# Patient Record
Sex: Female | Born: 1950
Health system: Southern US, Community
[De-identification: ages and names within clinical notes are randomized; demographics above are authoritative.]

## PROBLEM LIST (undated history)

## (undated) DIAGNOSIS — J449 Chronic obstructive pulmonary disease, unspecified: Secondary | ICD-10-CM

## (undated) DIAGNOSIS — F329 Major depressive disorder, single episode, unspecified: Secondary | ICD-10-CM

## (undated) DIAGNOSIS — F419 Anxiety disorder, unspecified: Secondary | ICD-10-CM

## (undated) DIAGNOSIS — I729 Aneurysm of unspecified site: Secondary | ICD-10-CM

## (undated) DIAGNOSIS — J439 Emphysema, unspecified: Secondary | ICD-10-CM

## (undated) DIAGNOSIS — M7071 Other bursitis of hip, right hip: Secondary | ICD-10-CM

## (undated) DIAGNOSIS — F32A Depression, unspecified: Secondary | ICD-10-CM

## (undated) HISTORY — DX: Depression, unspecified: F32.A

## (undated) HISTORY — PX: APPENDECTOMY: SHX54

## (undated) HISTORY — DX: Chronic obstructive pulmonary disease, unspecified: J44.9

## (undated) HISTORY — DX: Aneurysm of unspecified site: I72.9

## (undated) HISTORY — PX: OTHER SURGICAL HISTORY: SHX169

## (undated) HISTORY — DX: Other bursitis of hip, right hip: M70.71

## (undated) HISTORY — DX: Anxiety disorder, unspecified: F41.9

## (undated) HISTORY — DX: Major depressive disorder, single episode, unspecified: F32.9

---

## 2012-10-26 ENCOUNTER — Encounter (HOSPITAL_COMMUNITY): Payer: Self-pay | Admitting: Psychiatry

## 2012-10-26 ENCOUNTER — Ambulatory Visit (INDEPENDENT_AMBULATORY_CARE_PROVIDER_SITE_OTHER): Payer: Medicare Other | Admitting: Psychiatry

## 2012-10-26 VITALS — BP 140/83 | HR 95 | Ht 64.0 in | Wt 175.5 lb

## 2012-10-26 DIAGNOSIS — F1421 Cocaine dependence, in remission: Secondary | ICD-10-CM

## 2012-10-26 DIAGNOSIS — F1414 Cocaine abuse with cocaine-induced mood disorder: Secondary | ICD-10-CM

## 2012-10-26 DIAGNOSIS — F1020 Alcohol dependence, uncomplicated: Secondary | ICD-10-CM | POA: Insufficient documentation

## 2012-10-26 DIAGNOSIS — F314 Bipolar disorder, current episode depressed, severe, without psychotic features: Secondary | ICD-10-CM | POA: Insufficient documentation

## 2012-10-26 DIAGNOSIS — F319 Bipolar disorder, unspecified: Secondary | ICD-10-CM
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
## 2012-10-30 ENCOUNTER — Encounter: Payer: Self-pay | Admitting: Family Medicine

## 2012-10-30 ENCOUNTER — Ambulatory Visit (INDEPENDENT_AMBULATORY_CARE_PROVIDER_SITE_OTHER): Payer: Medicare Other | Admitting: Family Medicine

## 2012-10-30 VITALS — BP 114/63 | HR 97 | Wt 172.0 lb

## 2012-10-30 DIAGNOSIS — J449 Chronic obstructive pulmonary disease, unspecified: Secondary | ICD-10-CM | POA: Insufficient documentation

## 2012-10-30 DIAGNOSIS — Z720 Tobacco use: Secondary | ICD-10-CM | POA: Insufficient documentation

## 2012-10-30 DIAGNOSIS — J441 Chronic obstructive pulmonary disease with (acute) exacerbation: Secondary | ICD-10-CM

## 2012-10-30 DIAGNOSIS — I10 Essential (primary) hypertension: Secondary | ICD-10-CM

## 2012-10-30 DIAGNOSIS — F17200 Nicotine dependence, unspecified, uncomplicated: Secondary | ICD-10-CM

## 2012-10-30 MED ORDER — IPRATROPIUM-ALBUTEROL 0.5-2.5 (3) MG/3ML IN SOLN: 3.0000 mL | RESPIRATORY_TRACT | Status: DC

## 2012-10-30 MED ORDER — AZITHROMYCIN 250 MG PO TABS: ORAL_TABLET | ORAL | Status: DC

## 2012-10-30 MED ORDER — PREDNISONE 20 MG PO TABS: 40.0000 mg | ORAL_TABLET | Freq: Every day | ORAL | Status: DC

## 2012-10-30 MED ORDER — HYDROCHLOROTHIAZIDE 25 MG PO TABS: 25.0000 mg | ORAL_TABLET | Freq: Every day | ORAL | Status: DC

## 2012-10-30 MED ORDER — BUDESONIDE-FORMOTEROL FUMARATE 160-4.5 MCG/ACT IN AERO: 2.0000 | INHALATION_SPRAY | Freq: Two times a day (BID) | RESPIRATORY_TRACT | Status: DC
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
## 2012-10-30 NOTE — Patient Instructions (Signed)

## 2012-11-23 ENCOUNTER — Ambulatory Visit (HOSPITAL_COMMUNITY): Payer: Medicare Other | Admitting: Psychiatry

## 2012-11-27 ENCOUNTER — Ambulatory Visit: Payer: Medicare Other | Admitting: Family Medicine

## 2012-12-25 ENCOUNTER — Ambulatory Visit (INDEPENDENT_AMBULATORY_CARE_PROVIDER_SITE_OTHER): Payer: Medicare Other | Admitting: Psychiatry

## 2012-12-25 ENCOUNTER — Encounter (HOSPITAL_COMMUNITY): Payer: Self-pay | Admitting: Psychiatry

## 2012-12-25 VITALS — BP 120/72 | HR 84 | Ht 64.0 in | Wt 176.0 lb

## 2012-12-25 DIAGNOSIS — F1421 Cocaine dependence, in remission: Secondary | ICD-10-CM

## 2012-12-25 DIAGNOSIS — F3160 Bipolar disorder, current episode mixed, unspecified: Secondary | ICD-10-CM

## 2012-12-25 DIAGNOSIS — F1021 Alcohol dependence, in remission: Secondary | ICD-10-CM

## 2012-12-25 DIAGNOSIS — F319 Bipolar disorder, unspecified: Secondary | ICD-10-CM

## 2012-12-25 MED ORDER — RISPERIDONE 2 MG PO TABS: 2.0000 mg | ORAL_TABLET | Freq: Every day | ORAL | Status: DC

## 2012-12-25 MED ORDER — SERTRALINE HCL 100 MG PO TABS: 200.0000 mg | ORAL_TABLET | Freq: Every day | ORAL | Status: DC

## 2012-12-25 MED ORDER — BUPROPION HCL ER (XL) 150 MG PO TB24: 150.0000 mg | ORAL_TABLET | Freq: Every day | ORAL | Status: DC
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
## 2012-12-28 ENCOUNTER — Encounter: Payer: Self-pay | Admitting: Family Medicine

## 2012-12-28 ENCOUNTER — Ambulatory Visit (INDEPENDENT_AMBULATORY_CARE_PROVIDER_SITE_OTHER): Payer: Commercial Managed Care - HMO | Admitting: Family Medicine

## 2012-12-28 VITALS — BP 113/72 | HR 104 | Ht 64.0 in | Wt 172.0 lb

## 2012-12-28 DIAGNOSIS — Z23 Encounter for immunization: Secondary | ICD-10-CM | POA: Diagnosis not present

## 2012-12-28 DIAGNOSIS — G5711 Meralgia paresthetica, right lower limb: Secondary | ICD-10-CM

## 2012-12-28 DIAGNOSIS — J441 Chronic obstructive pulmonary disease with (acute) exacerbation: Secondary | ICD-10-CM

## 2012-12-28 DIAGNOSIS — G5710 Meralgia paresthetica, unspecified lower limb: Secondary | ICD-10-CM | POA: Diagnosis not present

## 2012-12-28 MED ORDER — BUDESONIDE-FORMOTEROL FUMARATE 160-4.5 MCG/ACT IN AERO: 2.0000 | INHALATION_SPRAY | Freq: Two times a day (BID) | RESPIRATORY_TRACT | Status: DC

## 2012-12-28 MED ORDER — GABAPENTIN 300 MG PO CAPS: 300.0000 mg | ORAL_CAPSULE | Freq: Every day | ORAL | Status: DC

## 2012-12-28 NOTE — Patient Instructions (Addendum)
Meralgia Paresthetica  Meralgia paresthetica (MP) is a disorder characterized by tingling, numbness, and burning pain in the outer side of the thigh. It occurs in men more than women. MP is generally found in middle-aged or overweight people. Sometimes, the disorder may disappear. CAUSES The disorder is caused by a nerve in the thigh being squeezed (compressed). MP may be associated with tight clothing, pregnancy, diabetes, and being overweight (obese). SYMPTOMS  Tingling, numbness, and burning in the outer thigh.  An area of the skin may be painful and sensitive to the touch. The symptoms often worsen after walking or standing. TREATMENT  Treatment is based on your symptoms and is mainly supportive. Treatment may include:  Wearing looser clothing.  Losing weight.  Avoiding prolonged standing or walking.  Taking medication.  Surgery if the pain is peristent or severe. MP usually eases or disappears after treatment. Surgery is not always fully successful. Document Released: 05/27/2002 Document Revised: 08/29/2011 Document Reviewed: 06/06/2005 [employer] Patient Information 2014 [employer].  
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
## 2013-01-18 ENCOUNTER — Ambulatory Visit: Payer: Medicare Other | Admitting: Sports Medicine

## 2013-01-22 ENCOUNTER — Ambulatory Visit (INDEPENDENT_AMBULATORY_CARE_PROVIDER_SITE_OTHER): Payer: Commercial Managed Care - HMO | Admitting: Sports Medicine

## 2013-01-22 ENCOUNTER — Encounter: Payer: Self-pay | Admitting: Sports Medicine

## 2013-01-22 VITALS — BP 114/71 | HR 99 | Wt 169.0 lb

## 2013-01-22 DIAGNOSIS — M7062 Trochanteric bursitis, left hip: Secondary | ICD-10-CM | POA: Insufficient documentation

## 2013-01-22 DIAGNOSIS — M76899 Other specified enthesopathies of unspecified lower limb, excluding foot: Secondary | ICD-10-CM

## 2013-01-22 DIAGNOSIS — M7061 Trochanteric bursitis, right hip: Secondary | ICD-10-CM

## 2013-01-22 MED ORDER — MELOXICAM 15 MG PO TABS: ORAL_TABLET | ORAL | Status: DC

## 2013-01-22 NOTE — Assessment & Plan Note (Addendum)
We will start conservatively with Mobic, formal physical therapy. She needs to work extensively aggressively and hip abductor rehabilitation. Like to see her back in one month, injection if no better.  If the trochanteric bursa is ineffective, we certainly need to keep meralgia paresthetica as well as lumbar radiculitis in our differential diagnosis.

## 2013-01-22 NOTE — Progress Notes (Signed)
  Subjective:    CC: Back and leg pain.  HPI: This is a pleasant 62 year old female with a history of cocaine abuse, and mood disorder, who comes in with a several month history of pain that she localizes over the lateral aspect of the right hip, causing numbness and tingling radiating down the lateral aspect of her leg but not past the knee. She also endorses back pain and tells me that the pain is worse with coughing, sneezing, and sitting in a car for long periods of time. She does endorse localized pain over the greater trochanter, tells me that this is the main source of her pain, and she cannot lay on that side in the bed. No bowel or bladder dysfunction, no constitutional symptoms. Pain is moderate, persistent, radiates as above.  Past medical history, Surgical history, Family history not pertinant except as noted below, Social history, Allergies, and medications have been entered into the medical record, reviewed, and no changes needed.   Review of Systems: No fevers, chills, night sweats, weight loss, chest pain, or shortness of breath.   Objective:    General: Well Developed, well nourished, and in no acute distress.  Neuro: Alert and oriented x3, extra-ocular muscles intact, sensation grossly intact.  HEENT: Normocephalic, atraumatic, pupils equal round reactive to light, neck supple, no masses, no lymphadenopathy, thyroid nonpalpable.  Skin: Warm and dry, no rashes. Cardiac: Regular rate and rhythm, no murmurs rubs or gallops, no lower extremity edema.  Respiratory: Clear to auscultation bilaterally. Not using accessory muscles, speaking in full sentences. Right Hip: ROM IR: 45 Deg, ER: 45 Deg, Flexion: 120 Deg, Extension: 100 Deg, Abduction: 45 Deg, Adduction: 45 Deg Strength IR: 5/5, ER: 5/5, Flexion: 5/5, Extension: 5/5, Abduction: 5/5, Adduction: 5/5 Pelvic alignment unremarkable to inspection and palpation. Standing hip rotation and gait without trendelenburg sign /  unsteadiness. Moderate tenderness to palpation over the greater trochanter. No tenderness to palpation just medial to the ASIS. No pain with FABER or FADIR. No SI joint tenderness and normal minimal SI movement.  Impression and Recommendations:

## 2013-01-29 ENCOUNTER — Ambulatory Visit (HOSPITAL_COMMUNITY): Payer: Medicare Other | Admitting: Psychiatry

## 2013-01-30 ENCOUNTER — Encounter (HOSPITAL_COMMUNITY): Payer: Self-pay | Admitting: Psychiatry

## 2013-01-30 ENCOUNTER — Ambulatory Visit (INDEPENDENT_AMBULATORY_CARE_PROVIDER_SITE_OTHER): Payer: Medicare HMO | Admitting: Psychiatry

## 2013-01-30 ENCOUNTER — Ambulatory Visit: Payer: Medicare HMO | Admitting: Rehabilitation

## 2013-01-30 VITALS — BP 122/70 | HR 68 | Ht 64.0 in | Wt 174.0 lb

## 2013-01-30 DIAGNOSIS — F1021 Alcohol dependence, in remission: Secondary | ICD-10-CM

## 2013-01-30 DIAGNOSIS — F319 Bipolar disorder, unspecified: Secondary | ICD-10-CM

## 2013-01-30 DIAGNOSIS — F3160 Bipolar disorder, current episode mixed, unspecified: Secondary | ICD-10-CM

## 2013-01-30 DIAGNOSIS — F1421 Cocaine dependence, in remission: Secondary | ICD-10-CM

## 2013-01-30 MED ORDER — LITHIUM CARBONATE 150 MG PO CAPS: ORAL_CAPSULE | ORAL | Status: DC

## 2013-01-30 MED ORDER — SERTRALINE HCL 100 MG PO TABS: 200.0000 mg | ORAL_TABLET | Freq: Every day | ORAL | Status: DC

## 2013-01-30 MED ORDER — RISPERIDONE 2 MG PO TABS: 2.0000 mg | ORAL_TABLET | Freq: Every day | ORAL | Status: DC

## 2013-01-30 MED ORDER — BUPROPION HCL ER (XL) 150 MG PO TB24: 150.0000 mg | ORAL_TABLET | Freq: Every day | ORAL | Status: DC

## 2013-01-30 MED ORDER — HYDROXYZINE PAMOATE 50 MG PO CAPS: 50.0000 mg | ORAL_CAPSULE | Freq: Four times a day (QID) | ORAL | Status: DC | PRN
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
## 2013-02-05 ENCOUNTER — Ambulatory Visit: Payer: Medicare HMO | Attending: Sports Medicine | Admitting: Rehabilitation

## 2013-02-05 DIAGNOSIS — M25559 Pain in unspecified hip: Secondary | ICD-10-CM | POA: Insufficient documentation

## 2013-02-05 DIAGNOSIS — IMO0001 Reserved for inherently not codable concepts without codable children: Secondary | ICD-10-CM | POA: Insufficient documentation

## 2013-02-05 DIAGNOSIS — R262 Difficulty in walking, not elsewhere classified: Secondary | ICD-10-CM | POA: Insufficient documentation

## 2013-02-07 ENCOUNTER — Encounter (HOSPITAL_COMMUNITY): Payer: Self-pay | Admitting: Psychiatry

## 2013-02-07 ENCOUNTER — Ambulatory Visit (INDEPENDENT_AMBULATORY_CARE_PROVIDER_SITE_OTHER): Payer: Medicare HMO | Admitting: Psychiatry

## 2013-02-07 VITALS — BP 116/73 | HR 88 | Ht 64.0 in | Wt 172.0 lb

## 2013-02-07 DIAGNOSIS — F1210 Cannabis abuse, uncomplicated: Secondary | ICD-10-CM

## 2013-02-07 DIAGNOSIS — F319 Bipolar disorder, unspecified: Secondary | ICD-10-CM

## 2013-02-07 DIAGNOSIS — F1020 Alcohol dependence, uncomplicated: Secondary | ICD-10-CM

## 2013-02-07 DIAGNOSIS — F3160 Bipolar disorder, current episode mixed, unspecified: Secondary | ICD-10-CM

## 2013-02-07 DIAGNOSIS — F1411 Cocaine abuse, in remission: Secondary | ICD-10-CM

## 2013-02-07 MED ORDER — NALTREXONE HCL 50 MG PO TABS: 50.0000 mg | ORAL_TABLET | Freq: Every day | ORAL | Status: DC

## 2013-02-07 MED ORDER — LITHIUM CARBONATE 150 MG PO CAPS: 150.0000 mg | ORAL_CAPSULE | Freq: Two times a day (BID) | ORAL | Status: DC
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
## 2013-02-08 DIAGNOSIS — F3160 Bipolar disorder, current episode mixed, unspecified: Secondary | ICD-10-CM | POA: Insufficient documentation

## 2013-02-08 DIAGNOSIS — F1210 Cannabis abuse, uncomplicated: Secondary | ICD-10-CM | POA: Insufficient documentation

## 2013-02-13 ENCOUNTER — Encounter (HOSPITAL_COMMUNITY): Payer: Self-pay | Admitting: Psychiatry

## 2013-02-13 ENCOUNTER — Ambulatory Visit (INDEPENDENT_AMBULATORY_CARE_PROVIDER_SITE_OTHER): Payer: Medicare HMO | Admitting: Psychiatry

## 2013-02-13 VITALS — BP 110/63 | HR 85 | Ht 64.0 in | Wt 172.0 lb

## 2013-02-13 DIAGNOSIS — F1421 Cocaine dependence, in remission: Secondary | ICD-10-CM

## 2013-02-13 DIAGNOSIS — F319 Bipolar disorder, unspecified: Secondary | ICD-10-CM

## 2013-02-13 DIAGNOSIS — F3160 Bipolar disorder, current episode mixed, unspecified: Secondary | ICD-10-CM

## 2013-02-13 DIAGNOSIS — F1020 Alcohol dependence, uncomplicated: Secondary | ICD-10-CM

## 2013-02-13 DIAGNOSIS — F1210 Cannabis abuse, uncomplicated: Secondary | ICD-10-CM
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
## 2013-02-14 ENCOUNTER — Telehealth (HOSPITAL_COMMUNITY): Payer: Self-pay | Admitting: Psychiatry

## 2013-02-14 DIAGNOSIS — F314 Bipolar disorder, current episode depressed, severe, without psychotic features: Secondary | ICD-10-CM

## 2013-02-14 LAB — TSH: TSH: 2.553 u[IU]/mL (ref 0.350–4.500)

## 2013-02-14 NOTE — Telephone Encounter (Signed)
Called daughter. Advised her to have patient hold lithium. Advised them to come in the morning to recheck level. Will leave Lab slip at reception in my class.

## 2013-02-14 NOTE — Telephone Encounter (Signed)
Will leave lab slip at reception. Asked patient to hold lithium;

## 2013-02-16 LAB — LITHIUM LEVEL: Lithium Lvl: 0.6 mEq/L — ABNORMAL LOW (ref 0.80–1.40)

## 2013-02-16 LAB — ETHANOL: Alcohol, Ethyl (B): <10 mg/dL (ref 0–10)

## 2013-02-16 LAB — BUN: BUN: 27 mg/dL — ABNORMAL HIGH (ref 6–23)

## 2013-02-16 LAB — CREATININE, SERUM: Creat: 1.48 mg/dL — ABNORMAL HIGH (ref 0.50–1.10)

## 2013-02-17 ENCOUNTER — Telehealth (HOSPITAL_COMMUNITY): Payer: Self-pay | Admitting: Psychiatry

## 2013-02-17 NOTE — Telephone Encounter (Signed)
Called patient.  Left message advising her to continue holding Lithium as her BUN is elevated her creatinine is coming down to normal. She has been educated on the importance of avoiding alcohol. She denies any further use of alcohol at her last appointment, however has had surreptitious use in the past, so is is unclear how long she will stay abstinent. Called to inform about results of lab and  Asked her to repeat her labs on 9/2. particularly BUN/creatinine and to go in for appointment with her PCP on Monday. Though Lithium would be the best mood stabilizer for her chronic suicidal ideation it does not appear as if her kidneys would tolerate a therapeutic dose.

## 2013-02-18 ENCOUNTER — Other Ambulatory Visit: Payer: Self-pay | Admitting: Family Medicine

## 2013-02-19 ENCOUNTER — Other Ambulatory Visit (HOSPITAL_COMMUNITY): Payer: Self-pay | Admitting: Psychiatry

## 2013-02-19 ENCOUNTER — Ambulatory Visit (INDEPENDENT_AMBULATORY_CARE_PROVIDER_SITE_OTHER): Payer: Commercial Managed Care - HMO | Admitting: Sports Medicine

## 2013-02-19 ENCOUNTER — Encounter: Payer: Self-pay | Admitting: Sports Medicine

## 2013-02-19 VITALS — BP 118/70 | HR 71 | Wt 167.0 lb

## 2013-02-19 DIAGNOSIS — M76899 Other specified enthesopathies of unspecified lower limb, excluding foot: Secondary | ICD-10-CM | POA: Diagnosis not present

## 2013-02-19 DIAGNOSIS — F314 Bipolar disorder, current episode depressed, severe, without psychotic features: Secondary | ICD-10-CM

## 2013-02-19 DIAGNOSIS — M7061 Trochanteric bursitis, right hip: Secondary | ICD-10-CM

## 2013-02-19 DIAGNOSIS — F3162 Bipolar disorder, current episode mixed, moderate: Secondary | ICD-10-CM

## 2013-02-19 NOTE — Assessment & Plan Note (Signed)
Resolved, return as needed. If pain returns I would like to recheck her hip abductor strength, if weak, I will place her through more therapy. If strong, at that point we can proceed to injection.

## 2013-02-19 NOTE — Progress Notes (Signed)
Ordered repeat labs.

## 2013-02-19 NOTE — Progress Notes (Signed)
  Subjective:    CC: Followup  HPI: Right trochanteric bursitis will result after conservative measures including aggressive hip abductor rehabilitation, Mobic. She's also noted that she started to lose some weight since working out the specific muscles. Pain is gone.  Past medical history, Surgical history, Family history not pertinant except as noted below, Social history, Allergies, and medications have been entered into the medical record, reviewed, and no changes needed.   Review of Systems: No fevers, chills, night sweats, weight loss, chest pain, or shortness of breath.   Objective:    General: Well Developed, well nourished, and in no acute distress.  Neuro: Alert and oriented x3, extra-ocular muscles intact, sensation grossly intact.  HEENT: Normocephalic, atraumatic, pupils equal round reactive to light, neck supple, no masses, no lymphadenopathy, thyroid nonpalpable.  Skin: Warm and dry, no rashes. Cardiac: Regular rate and rhythm, no murmurs rubs or gallops, no lower extremity edema.  Respiratory: Clear to auscultation bilaterally. Not using accessory muscles, speaking in full sentences. Right Hip: ROM IR: 45 Deg, ER: 45 Deg, Flexion: 120 Deg, Extension: 100 Deg, Abduction: 45 Deg, Adduction: 45 Deg Strength IR: 5/5, ER: 5/5, Flexion: 5/5, Extension: 5/5, Abduction: 5/5, Adduction: 5/5 Pelvic alignment unremarkable to inspection and palpation. Standing hip rotation and gait without trendelenburg sign / unsteadiness. Greater trochanter without tenderness to palpation. No tenderness over piriformis and greater trochanter. No pain with FABER or FADIR. No SI joint tenderness and normal minimal SI movement. Excellent bilateral hip abductor strength.  Impression and Recommendations:

## 2013-02-20 ENCOUNTER — Telehealth (HOSPITAL_COMMUNITY): Payer: Self-pay | Admitting: Psychiatry

## 2013-02-20 ENCOUNTER — Ambulatory Visit: Payer: Medicare HMO | Admitting: Rehabilitation

## 2013-02-20 LAB — CREATININE, SERUM: Creat: 1.19 mg/dL — ABNORMAL HIGH (ref 0.50–1.10)

## 2013-02-20 LAB — HEPATIC FUNCTION PANEL
Bilirubin, Direct: 0.1 mg/dL (ref 0.0–0.3)
Total Bilirubin: 0.4 mg/dL (ref 0.3–1.2)

## 2013-02-20 LAB — ETHANOL: Alcohol, Ethyl (B): <10 mg/dL (ref 0–10)

## 2013-02-20 LAB — BUN: BUN: 26 mg/dL — ABNORMAL HIGH (ref 6–23)

## 2013-02-20 LAB — LITHIUM LEVEL: Lithium Lvl: 0.2 mEq/L — ABNORMAL LOW (ref 0.80–1.40)

## 2013-02-20 NOTE — Telephone Encounter (Signed)
Patient reports she is doing well off Lithium. She denies any return of suicidal thoughts. Labs reviewed Creatinine returning to baseline. Advised her to avoid alcohol.  Lithium would not be an option for her in the future, but if used her kidney function would need to be monitored, possibly weekly.

## 2013-03-05 ENCOUNTER — Ambulatory Visit (INDEPENDENT_AMBULATORY_CARE_PROVIDER_SITE_OTHER): Payer: Medicare HMO | Admitting: Psychiatry

## 2013-03-05 ENCOUNTER — Encounter (HOSPITAL_COMMUNITY): Payer: Self-pay | Admitting: Psychiatry

## 2013-03-05 VITALS — BP 118/67 | HR 80 | Ht 64.0 in | Wt 162.0 lb

## 2013-03-05 DIAGNOSIS — F1421 Cocaine dependence, in remission: Secondary | ICD-10-CM

## 2013-03-05 DIAGNOSIS — F319 Bipolar disorder, unspecified: Secondary | ICD-10-CM

## 2013-03-05 DIAGNOSIS — F1020 Alcohol dependence, uncomplicated: Secondary | ICD-10-CM

## 2013-03-05 DIAGNOSIS — F3160 Bipolar disorder, current episode mixed, unspecified: Secondary | ICD-10-CM

## 2013-03-05 DIAGNOSIS — F1210 Cannabis abuse, uncomplicated: Secondary | ICD-10-CM

## 2013-03-05 MED ORDER — BUPROPION HCL ER (XL) 150 MG PO TB24: 300.0000 mg | ORAL_TABLET | Freq: Every day | ORAL | Status: DC

## 2013-03-05 MED ORDER — RISPERIDONE 3 MG PO TABS: 3.0000 mg | ORAL_TABLET | Freq: Every day | ORAL | Status: DC
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
## 2013-03-31 ENCOUNTER — Other Ambulatory Visit (HOSPITAL_COMMUNITY): Payer: Self-pay | Admitting: Psychiatry

## 2013-04-02 NOTE — Telephone Encounter (Signed)
Escript for naltrexone provided.

## 2013-04-03 ENCOUNTER — Ambulatory Visit (HOSPITAL_COMMUNITY): Payer: Medicare HMO | Admitting: Psychiatry

## 2013-04-05 ENCOUNTER — Ambulatory Visit (INDEPENDENT_AMBULATORY_CARE_PROVIDER_SITE_OTHER): Payer: Commercial Managed Care - HMO | Admitting: Family Medicine

## 2013-04-05 ENCOUNTER — Encounter: Payer: Self-pay | Admitting: Family Medicine

## 2013-04-05 VITALS — BP 109/62 | HR 88 | Wt 165.0 lb

## 2013-04-05 DIAGNOSIS — F17200 Nicotine dependence, unspecified, uncomplicated: Secondary | ICD-10-CM

## 2013-04-05 DIAGNOSIS — F1020 Alcohol dependence, uncomplicated: Secondary | ICD-10-CM

## 2013-04-05 DIAGNOSIS — R252 Cramp and spasm: Secondary | ICD-10-CM

## 2013-04-05 DIAGNOSIS — E785 Hyperlipidemia, unspecified: Secondary | ICD-10-CM

## 2013-04-05 DIAGNOSIS — Z720 Tobacco use: Secondary | ICD-10-CM

## 2013-04-05 DIAGNOSIS — I10 Essential (primary) hypertension: Secondary | ICD-10-CM

## 2013-04-05 DIAGNOSIS — J449 Chronic obstructive pulmonary disease, unspecified: Secondary | ICD-10-CM

## 2013-04-05 LAB — COMPLETE METABOLIC PANEL WITH GFR
AST: 17 U/L (ref 0–37)
Alkaline Phosphatase: 79 U/L (ref 39–117)
BUN: 18 mg/dL (ref 6–23)
Calcium: 9.5 mg/dL (ref 8.4–10.5)
Chloride: 100 mEq/L (ref 96–112)
Creat: 1.15 mg/dL — ABNORMAL HIGH (ref 0.50–1.10)
GFR, Est Non African American: 51 mL/min — ABNORMAL LOW
Glucose, Bld: 209 mg/dL — ABNORMAL HIGH (ref 70–99)

## 2013-04-05 LAB — LIPID PANEL
Cholesterol: 215 mg/dL — ABNORMAL HIGH (ref 0–200)
Triglycerides: 105 mg/dL (ref <150)
VLDL: 21 mg/dL (ref 0–40)

## 2013-04-05 MED ORDER — ALBUTEROL SULFATE HFA 108 (90 BASE) MCG/ACT IN AERS: 2.0000 | INHALATION_SPRAY | Freq: Four times a day (QID) | RESPIRATORY_TRACT | Status: DC | PRN

## 2013-04-05 MED ORDER — GABAPENTIN 300 MG PO CAPS: 300.0000 mg | ORAL_CAPSULE | Freq: Three times a day (TID) | ORAL | Status: DC
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
## 2013-04-07 ENCOUNTER — Encounter: Payer: Self-pay | Admitting: Family Medicine

## 2013-04-07 DIAGNOSIS — N183 Chronic kidney disease, stage 3 (moderate): Secondary | ICD-10-CM

## 2013-04-09 ENCOUNTER — Ambulatory Visit (HOSPITAL_COMMUNITY): Payer: Medicare HMO | Admitting: Psychiatry

## 2013-04-12 ENCOUNTER — Other Ambulatory Visit: Payer: Self-pay | Admitting: Family Medicine

## 2013-04-17 ENCOUNTER — Other Ambulatory Visit: Payer: Self-pay | Admitting: Family Medicine

## 2013-04-17 ENCOUNTER — Other Ambulatory Visit (HOSPITAL_COMMUNITY): Payer: Self-pay | Admitting: Psychiatry

## 2013-04-28 ENCOUNTER — Other Ambulatory Visit (HOSPITAL_COMMUNITY): Payer: Self-pay | Admitting: Psychiatry

## 2013-04-28 ENCOUNTER — Other Ambulatory Visit: Payer: Self-pay | Admitting: Family Medicine

## 2013-04-29 NOTE — Telephone Encounter (Signed)
Sent in prescription for wellbutrin.

## 2013-05-10 ENCOUNTER — Encounter (INDEPENDENT_AMBULATORY_CARE_PROVIDER_SITE_OTHER): Payer: Self-pay

## 2013-05-10 ENCOUNTER — Ambulatory Visit (HOSPITAL_COMMUNITY): Payer: Medicare HMO | Admitting: Psychiatry

## 2013-05-10 ENCOUNTER — Ambulatory Visit (INDEPENDENT_AMBULATORY_CARE_PROVIDER_SITE_OTHER): Payer: Medicare HMO | Admitting: Psychiatry

## 2013-05-10 ENCOUNTER — Encounter (HOSPITAL_COMMUNITY): Payer: Self-pay | Admitting: Psychiatry

## 2013-05-10 VITALS — BP 131/70 | HR 98 | Ht 64.0 in | Wt 162.0 lb

## 2013-05-10 DIAGNOSIS — F1421 Cocaine dependence, in remission: Secondary | ICD-10-CM

## 2013-05-10 DIAGNOSIS — F1020 Alcohol dependence, uncomplicated: Secondary | ICD-10-CM

## 2013-05-10 DIAGNOSIS — F319 Bipolar disorder, unspecified: Secondary | ICD-10-CM

## 2013-05-10 DIAGNOSIS — F1210 Cannabis abuse, uncomplicated: Secondary | ICD-10-CM

## 2013-05-10 DIAGNOSIS — F3160 Bipolar disorder, current episode mixed, unspecified: Secondary | ICD-10-CM

## 2013-05-10 MED ORDER — NALTREXONE HCL 50 MG PO TABS: 50.0000 mg | ORAL_TABLET | Freq: Every day | ORAL | Status: DC

## 2013-05-10 MED ORDER — BUPROPION HCL ER (XL) 150 MG PO TB24: 150.0000 mg | ORAL_TABLET | ORAL | Status: DC

## 2013-05-10 MED ORDER — SERTRALINE HCL 100 MG PO TABS: 100.0000 mg | ORAL_TABLET | Freq: Every day | ORAL | Status: DC

## 2013-05-10 MED ORDER — RISPERIDONE 3 MG PO TABS: 3.0000 mg | ORAL_TABLET | Freq: Every day | ORAL | Status: DC
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
## 2013-05-31 ENCOUNTER — Other Ambulatory Visit (HOSPITAL_COMMUNITY): Payer: Self-pay | Admitting: Psychiatry

## 2013-06-07 NOTE — Telephone Encounter (Signed)
Refill request for risperidone. She should have 2 refills remaining.

## 2013-06-19 ENCOUNTER — Other Ambulatory Visit (HOSPITAL_COMMUNITY): Payer: Self-pay | Admitting: Psychiatry

## 2013-06-19 NOTE — Telephone Encounter (Signed)
Refilled sertraline

## 2013-06-28 ENCOUNTER — Encounter (HOSPITAL_COMMUNITY): Payer: Self-pay | Admitting: Psychiatry

## 2013-06-28 ENCOUNTER — Encounter (INDEPENDENT_AMBULATORY_CARE_PROVIDER_SITE_OTHER): Payer: Self-pay

## 2013-06-28 ENCOUNTER — Ambulatory Visit (INDEPENDENT_AMBULATORY_CARE_PROVIDER_SITE_OTHER): Payer: Medicare HMO | Admitting: Psychiatry

## 2013-06-28 VITALS — BP 120/70 | HR 85 | Wt 161.0 lb

## 2013-06-28 DIAGNOSIS — F1221 Cannabis dependence, in remission: Secondary | ICD-10-CM

## 2013-06-28 DIAGNOSIS — F314 Bipolar disorder, current episode depressed, severe, without psychotic features: Secondary | ICD-10-CM

## 2013-06-28 DIAGNOSIS — F1210 Cannabis abuse, uncomplicated: Secondary | ICD-10-CM

## 2013-06-28 DIAGNOSIS — F3160 Bipolar disorder, current episode mixed, unspecified: Secondary | ICD-10-CM

## 2013-06-28 DIAGNOSIS — N183 Chronic kidney disease, stage 3 unspecified: Secondary | ICD-10-CM

## 2013-06-28 DIAGNOSIS — F1020 Alcohol dependence, uncomplicated: Secondary | ICD-10-CM

## 2013-06-28 DIAGNOSIS — F1414 Cocaine abuse with cocaine-induced mood disorder: Secondary | ICD-10-CM

## 2013-06-28 DIAGNOSIS — F319 Bipolar disorder, unspecified: Secondary | ICD-10-CM

## 2013-06-28 MED ORDER — RISPERIDONE 2 MG PO TABS: 2.0000 mg | ORAL_TABLET | Freq: Every day | ORAL | Status: DC

## 2013-06-28 MED ORDER — LAMOTRIGINE 25 MG PO TABS: ORAL_TABLET | ORAL | Status: DC
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
## 2013-07-05 ENCOUNTER — Other Ambulatory Visit (HOSPITAL_COMMUNITY): Payer: Self-pay | Admitting: Psychiatry

## 2013-07-05 NOTE — Telephone Encounter (Signed)
Refill request for Wellbutrin. Called pharmacy. Patient has refills of medications.

## 2013-07-07 ENCOUNTER — Other Ambulatory Visit: Payer: Self-pay | Admitting: Family Medicine

## 2013-07-25 ENCOUNTER — Other Ambulatory Visit (HOSPITAL_COMMUNITY): Payer: Self-pay | Admitting: Psychiatry

## 2013-07-25 ENCOUNTER — Other Ambulatory Visit: Payer: Self-pay | Admitting: Family Medicine

## 2013-07-26 ENCOUNTER — Other Ambulatory Visit: Payer: Self-pay | Admitting: Family Medicine

## 2013-07-26 NOTE — Telephone Encounter (Signed)
Refill request

## 2013-08-02 ENCOUNTER — Ambulatory Visit (INDEPENDENT_AMBULATORY_CARE_PROVIDER_SITE_OTHER): Payer: Medicare HMO | Admitting: Psychiatry

## 2013-08-02 ENCOUNTER — Encounter (HOSPITAL_COMMUNITY): Payer: Self-pay | Admitting: Psychiatry

## 2013-08-02 ENCOUNTER — Other Ambulatory Visit: Payer: Self-pay | Admitting: Family Medicine

## 2013-08-02 VITALS — BP 120/69 | HR 81 | Wt 166.0 lb

## 2013-08-02 DIAGNOSIS — F1421 Cocaine dependence, in remission: Secondary | ICD-10-CM

## 2013-08-02 DIAGNOSIS — F3160 Bipolar disorder, current episode mixed, unspecified: Secondary | ICD-10-CM

## 2013-08-02 DIAGNOSIS — F1020 Alcohol dependence, uncomplicated: Secondary | ICD-10-CM

## 2013-08-02 DIAGNOSIS — F319 Bipolar disorder, unspecified: Secondary | ICD-10-CM

## 2013-08-02 DIAGNOSIS — F1210 Cannabis abuse, uncomplicated: Secondary | ICD-10-CM

## 2013-08-02 MED ORDER — RISPERIDONE 2 MG PO TABS: 2.0000 mg | ORAL_TABLET | Freq: Every day | ORAL | Status: DC

## 2013-08-02 MED ORDER — BUPROPION HCL ER (XL) 150 MG PO TB24: 300.0000 mg | ORAL_TABLET | ORAL | Status: DC

## 2013-08-02 MED ORDER — SERTRALINE HCL 100 MG PO TABS: ORAL_TABLET | ORAL | Status: DC

## 2013-08-02 MED ORDER — NALTREXONE HCL 50 MG PO TABS: 50.0000 mg | ORAL_TABLET | Freq: Every day | ORAL | Status: DC

## 2013-08-02 MED ORDER — HYDROXYZINE HCL 50 MG PO TABS: 100.0000 mg | ORAL_TABLET | Freq: Every evening | ORAL | Status: DC | PRN
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
## 2013-08-11 ENCOUNTER — Other Ambulatory Visit (HOSPITAL_COMMUNITY): Payer: Self-pay | Admitting: Psychiatry

## 2013-08-16 ENCOUNTER — Other Ambulatory Visit (HOSPITAL_COMMUNITY): Payer: Self-pay | Admitting: Psychiatry

## 2013-08-16 NOTE — Telephone Encounter (Signed)
Called pharmacy, she is out of naltrexone but insurance will only cover naltrexone on the 1st of mARCH.

## 2013-08-16 NOTE — Telephone Encounter (Signed)
Refilled lamictal

## 2013-09-13 ENCOUNTER — Encounter (HOSPITAL_COMMUNITY): Payer: Self-pay | Admitting: Psychiatry

## 2013-09-13 ENCOUNTER — Encounter (INDEPENDENT_AMBULATORY_CARE_PROVIDER_SITE_OTHER): Payer: Self-pay

## 2013-09-13 ENCOUNTER — Ambulatory Visit (INDEPENDENT_AMBULATORY_CARE_PROVIDER_SITE_OTHER): Payer: Medicare HMO | Admitting: Psychiatry

## 2013-09-13 VITALS — BP 106/63 | HR 81 | Wt 162.5 lb

## 2013-09-13 DIAGNOSIS — F1210 Cannabis abuse, uncomplicated: Secondary | ICD-10-CM

## 2013-09-13 DIAGNOSIS — F1421 Cocaine dependence, in remission: Secondary | ICD-10-CM

## 2013-09-13 DIAGNOSIS — F3160 Bipolar disorder, current episode mixed, unspecified: Secondary | ICD-10-CM

## 2013-09-13 DIAGNOSIS — F1020 Alcohol dependence, uncomplicated: Secondary | ICD-10-CM

## 2013-09-13 DIAGNOSIS — F319 Bipolar disorder, unspecified: Secondary | ICD-10-CM

## 2013-09-13 MED ORDER — BUPROPION HCL ER (XL) 150 MG PO TB24: 300.0000 mg | ORAL_TABLET | ORAL | Status: DC

## 2013-09-13 MED ORDER — HYDROXYZINE HCL 50 MG PO TABS: 100.0000 mg | ORAL_TABLET | Freq: Every evening | ORAL | Status: DC | PRN

## 2013-09-13 MED ORDER — NALTREXONE HCL 50 MG PO TABS: 50.0000 mg | ORAL_TABLET | Freq: Every day | ORAL | Status: DC

## 2013-09-13 MED ORDER — SERTRALINE HCL 100 MG PO TABS: ORAL_TABLET | ORAL | Status: DC

## 2013-09-13 MED ORDER — LAMOTRIGINE 150 MG PO TABS: 150.0000 mg | ORAL_TABLET | Freq: Every day | ORAL | Status: DC

## 2013-09-13 MED ORDER — RISPERIDONE 2 MG PO TABS: 2.0000 mg | ORAL_TABLET | Freq: Every day | ORAL | Status: DC
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
## 2013-09-29 ENCOUNTER — Other Ambulatory Visit (HOSPITAL_COMMUNITY): Payer: Self-pay | Admitting: Psychiatry

## 2013-10-02 ENCOUNTER — Other Ambulatory Visit: Payer: Self-pay | Admitting: Family Medicine

## 2013-10-02 NOTE — Telephone Encounter (Signed)
Refilled Wellbutrin.

## 2013-10-04 ENCOUNTER — Ambulatory Visit: Payer: Medicare HMO | Admitting: Family Medicine

## 2013-10-08 ENCOUNTER — Encounter: Payer: Self-pay | Admitting: Family Medicine

## 2013-10-08 ENCOUNTER — Ambulatory Visit (INDEPENDENT_AMBULATORY_CARE_PROVIDER_SITE_OTHER): Payer: Medicare HMO | Admitting: Family Medicine

## 2013-10-08 VITALS — BP 127/64 | HR 85 | Wt 162.0 lb

## 2013-10-08 DIAGNOSIS — J449 Chronic obstructive pulmonary disease, unspecified: Secondary | ICD-10-CM

## 2013-10-08 DIAGNOSIS — F314 Bipolar disorder, current episode depressed, severe, without psychotic features: Secondary | ICD-10-CM

## 2013-10-08 DIAGNOSIS — I10 Essential (primary) hypertension: Secondary | ICD-10-CM

## 2013-10-08 DIAGNOSIS — F17200 Nicotine dependence, unspecified, uncomplicated: Secondary | ICD-10-CM

## 2013-10-08 DIAGNOSIS — Z720 Tobacco use: Secondary | ICD-10-CM

## 2013-10-08 MED ORDER — ALBUTEROL SULFATE HFA 108 (90 BASE) MCG/ACT IN AERS: 2.0000 | INHALATION_SPRAY | Freq: Four times a day (QID) | RESPIRATORY_TRACT | Status: DC | PRN

## 2013-10-08 MED ORDER — ACLIDINIUM BROMIDE 400 MCG/ACT IN AEPB: INHALATION_SPRAY | RESPIRATORY_TRACT | Status: DC

## 2013-10-08 NOTE — Patient Instructions (Signed)
Decrease hydroxizine to one tab at bedtime.  Try the Carlos Americanudorza for a month.

## 2013-10-08 NOTE — Progress Notes (Signed)
   Subjective:    Patient ID: Sheila Beck, female    DOB: 02/10/1951, 63 y.o.   MRN: [id number]  HPI Hypertension- Pt denies chest pain, SOB, dizziness, or heart palpitations.  Taking meds as directed w/o problems.  Denies medication side effects.    COPD - no flares this spring.  Says using her albuterol 2-3 times per day.  Says sometimes har hard to get a deep breath but no wheezing or specifically shortness of breath.  She feels like she is overmedicated with her psychiatric medications. She has a followup with psychiatry in about 2 weeks. Unfortunately he is in transition. He was previously working here in our building but is moving to another location and [place] and so it may be several weeks before she is actually able to see him to adjust her medications. She says she's felt off balance since the change and has been falling. She was very sleepy during the daytime.  Review of Systems     Objective:   Physical Exam  Constitutional: She is oriented to person, place, and time. She appears well-developed and well-nourished.  HENT:  Head: Normocephalic and atraumatic.  Cardiovascular: Normal rate, regular rhythm and normal heart sounds.   Pulmonary/Chest: Effort normal and breath sounds normal.  Neurological: She is alert and oriented to person, place, and time.  Skin: Skin is warm and dry.  Psychiatric: She has a normal mood and affect. Her behavior is normal.          Assessment & Plan:  Hypertension-well-controlled. Continue current regimen. Followup in 6 months. Due for BMP today.  COPD - will add tudorza. Demonstrated how to use the inhaler properly. Given coupon card for 30 day trial. Will refill her albuterol as well.   Bipolar disorder-we will decrease her Atarax to 1 at bedtime and see if this helps. If not then please give me a call back. We'll try to get her back in with Dr. Baron SanePutheval as soon as he opens his new location.  Declined pneumonia vaccine. Declines she  was fixing. Declines mammogram. Declines Pap smear. Declines colon cancer screening.  Tobacco abuse-encourage smoking cessation. She says she enjoys it too much to quit.

## 2013-10-10 ENCOUNTER — Telehealth (HOSPITAL_COMMUNITY): Payer: Self-pay

## 2013-10-10 NOTE — Telephone Encounter (Signed)
Daughter left message in [place] office. In Dr. Darl HouseholderPuthuvel's absence, message [facility] to [facility]: 4/23 @ 1100: PT isn't speaking correctly, she is falling all the time, and isn't remembering anything. Medication was raised a week ago, not sure which one, but this all started when the medicine was increased. Please call pt's daughter to advise.           Left message for daughter to contact [facility]  Office at her convenience.

## 2013-10-28 ENCOUNTER — Other Ambulatory Visit (HOSPITAL_COMMUNITY): Payer: Self-pay | Admitting: Psychiatry

## 2013-11-01 ENCOUNTER — Other Ambulatory Visit: Payer: Self-pay | Admitting: Family Medicine

## 2013-11-27 ENCOUNTER — Ambulatory Visit (INDEPENDENT_AMBULATORY_CARE_PROVIDER_SITE_OTHER): Payer: Medicare HMO | Admitting: Physician Assistant

## 2013-11-27 ENCOUNTER — Encounter (HOSPITAL_COMMUNITY): Payer: Self-pay | Admitting: Physician Assistant

## 2013-11-27 ENCOUNTER — Encounter (INDEPENDENT_AMBULATORY_CARE_PROVIDER_SITE_OTHER): Payer: Self-pay

## 2013-11-27 VITALS — BP 119/61 | HR 69 | Ht 61.5 in | Wt 156.0 lb

## 2013-11-27 DIAGNOSIS — F1421 Cocaine dependence, in remission: Secondary | ICD-10-CM

## 2013-11-27 DIAGNOSIS — F319 Bipolar disorder, unspecified: Secondary | ICD-10-CM

## 2013-11-27 DIAGNOSIS — F3160 Bipolar disorder, current episode mixed, unspecified: Secondary | ICD-10-CM

## 2013-11-27 DIAGNOSIS — F314 Bipolar disorder, current episode depressed, severe, without psychotic features: Secondary | ICD-10-CM

## 2013-11-27 DIAGNOSIS — F1210 Cannabis abuse, uncomplicated: Secondary | ICD-10-CM

## 2013-11-27 DIAGNOSIS — F1020 Alcohol dependence, uncomplicated: Secondary | ICD-10-CM

## 2013-11-27 DIAGNOSIS — F1010 Alcohol abuse, uncomplicated: Secondary | ICD-10-CM

## 2013-11-27 DIAGNOSIS — F1414 Cocaine abuse with cocaine-induced mood disorder: Secondary | ICD-10-CM

## 2013-11-27 MED ORDER — NALTREXONE HCL 50 MG PO TABS: 50.0000 mg | ORAL_TABLET | Freq: Every day | ORAL | Status: DC

## 2013-11-27 MED ORDER — GABAPENTIN 300 MG PO CAPS: ORAL_CAPSULE | ORAL | Status: DC

## 2013-11-27 MED ORDER — SERTRALINE HCL 100 MG PO TABS: ORAL_TABLET | ORAL | Status: DC

## 2013-11-27 MED ORDER — BUPROPION HCL ER (XL) 300 MG PO TB24: 300.0000 mg | ORAL_TABLET | Freq: Every day | ORAL | Status: DC

## 2013-11-27 MED ORDER — HYDROXYZINE HCL 50 MG PO TABS: 100.0000 mg | ORAL_TABLET | Freq: Every evening | ORAL | Status: DC | PRN

## 2013-11-27 MED ORDER — LAMOTRIGINE 150 MG PO TABS: 150.0000 mg | ORAL_TABLET | Freq: Every day | ORAL | Status: DC

## 2013-11-27 MED ORDER — RISPERIDONE 2 MG PO TABS: 2.0000 mg | ORAL_TABLET | Freq: Every day | ORAL | Status: DC

## 2013-11-27 NOTE — Addendum Note (Signed)
Addended by: Verne Spurr T on: 11/27/2013 03:02 PM   Modules accepted: Orders

## 2013-11-27 NOTE — Progress Notes (Signed)
   [place] Follow-up Outpatient Visit  Sheila Beck 1951/06/10  Date: 11/27/2013 Diagnosis: AXIS I Alcohol use disorder, severe-improving  Cannabis use disorder, mild-improving  Bipolar I Disorder--mild improvement overall  Cocaine Dependence in Full Remission-stable    Subjective: Met with the patient Sheila Beck who is in for medication refill. She says in the interim she is doing ok. She rates her depression is a 5/10, and her anxiety is better as well at a 4/10.  Her biggest source of stress is her 48 year old grandson who doesn't listen to anything she says. She notes her sleep is good, and her appetite is fair. She enjoys walking the dog for pleasure, and hopes to return to the [facility] as a source of socializing.  Filed Vitals:   11/27/13 1427  BP: 119/61  Pulse: 69  Social: patient states that she has been sober since March of this year.              Some occasional Cannabis abuse              No cocaine  Mental Status Examination  Appearance: casual shorts and tank top Alert: Yes Attention: good  Cooperative: Yes Eye Contact: Good Speech: clear and goal directed Psychomotor Activity: Normal Memory/Concentration: good Oriented: person, place and time/date Mood: Anxious and Depressed Affect: Congruent Thought Processes and Associations: Goal Directed Fund of Knowledge: Good Thought Content: Suicidal ideation the same, no intent, no plans, no means Insight: Good Judgement: Good  Diagnosis:  AXIS I Alcohol use disorder, severe-improving  Cannabis use disorder, mild-improving  Bipolar I Disorder--mild improvement overall  Cocaine Dependence in Full Remission-stable   Treatment Plan: 1. Continue current plan of care with no changes at this time. 2. Meds will be rewritten x 90 days. Marlane Hatcher. Shamus Desantis RPAC 2:49 PM 11/27/2013

## 2013-12-21 ENCOUNTER — Other Ambulatory Visit: Payer: Self-pay | Admitting: Family Medicine

## 2014-02-22 ENCOUNTER — Other Ambulatory Visit (HOSPITAL_COMMUNITY): Payer: Self-pay | Admitting: Physician Assistant

## 2014-02-25 ENCOUNTER — Encounter (HOSPITAL_COMMUNITY): Payer: Self-pay | Admitting: Physician Assistant

## 2014-02-25 ENCOUNTER — Ambulatory Visit (INDEPENDENT_AMBULATORY_CARE_PROVIDER_SITE_OTHER): Payer: Medicare HMO | Admitting: Physician Assistant

## 2014-02-25 ENCOUNTER — Encounter (INDEPENDENT_AMBULATORY_CARE_PROVIDER_SITE_OTHER): Payer: Self-pay

## 2014-02-25 VITALS — BP 132/65 | HR 64 | Ht 62.0 in | Wt 145.0 lb

## 2014-02-25 DIAGNOSIS — F329 Major depressive disorder, single episode, unspecified: Secondary | ICD-10-CM

## 2014-02-25 DIAGNOSIS — F314 Bipolar disorder, current episode depressed, severe, without psychotic features: Secondary | ICD-10-CM

## 2014-02-25 DIAGNOSIS — F3289 Other specified depressive episodes: Secondary | ICD-10-CM

## 2014-02-25 DIAGNOSIS — F1021 Alcohol dependence, in remission: Secondary | ICD-10-CM

## 2014-02-25 DIAGNOSIS — F1210 Cannabis abuse, uncomplicated: Secondary | ICD-10-CM

## 2014-02-25 DIAGNOSIS — F1020 Alcohol dependence, uncomplicated: Secondary | ICD-10-CM

## 2014-02-25 DIAGNOSIS — F3160 Bipolar disorder, current episode mixed, unspecified: Secondary | ICD-10-CM

## 2014-02-25 DIAGNOSIS — F1414 Cocaine abuse with cocaine-induced mood disorder: Secondary | ICD-10-CM

## 2014-02-25 MED ORDER — HYDROXYZINE HCL 50 MG PO TABS: 100.0000 mg | ORAL_TABLET | Freq: Every evening | ORAL | Status: DC | PRN

## 2014-02-25 MED ORDER — SERTRALINE HCL 100 MG PO TABS: ORAL_TABLET | ORAL | Status: DC

## 2014-02-25 MED ORDER — BUPROPION HCL ER (XL) 300 MG PO TB24: 300.0000 mg | ORAL_TABLET | Freq: Every day | ORAL | Status: DC

## 2014-02-25 MED ORDER — LAMOTRIGINE 200 MG PO TABS
200.0000 mg | ORAL_TABLET | Freq: Every day | ORAL | Status: DC
Start: 2014-02-25 — End: 2014-06-03

## 2014-02-25 MED ORDER — NALTREXONE HCL 50 MG PO TABS: 50.0000 mg | ORAL_TABLET | Freq: Every day | ORAL | Status: DC

## 2014-02-25 MED ORDER — GABAPENTIN 300 MG PO CAPS
ORAL_CAPSULE | ORAL | Status: DC
Start: 2014-02-25 — End: 2014-06-03

## 2014-02-25 MED ORDER — RISPERIDONE 2 MG PO TABS: 2.0000 mg | ORAL_TABLET | Freq: Every day | ORAL | Status: DC

## 2014-02-25 NOTE — Patient Instructions (Signed)
1. Follow up in 1 month. 2. Consider out patient therapist as discussed.
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
## 2014-02-27 ENCOUNTER — Ambulatory Visit (HOSPITAL_COMMUNITY): Payer: Medicare HMO | Admitting: Physician Assistant

## 2014-03-10 ENCOUNTER — Other Ambulatory Visit (HOSPITAL_COMMUNITY): Payer: Self-pay | Admitting: Physician Assistant

## 2014-03-10 ENCOUNTER — Other Ambulatory Visit: Payer: Self-pay | Admitting: Family Medicine

## 2014-03-18 ENCOUNTER — Other Ambulatory Visit: Payer: Self-pay | Admitting: Family Medicine

## 2014-03-21 NOTE — Telephone Encounter (Signed)
Pt called and needs a refill on Lamictal and Meloxicam. Informed pt, I will check with pharmacy and contact her back.

## 2014-03-25 ENCOUNTER — Ambulatory Visit (INDEPENDENT_AMBULATORY_CARE_PROVIDER_SITE_OTHER): Payer: Medicare HMO | Admitting: Physician Assistant

## 2014-03-25 ENCOUNTER — Encounter (HOSPITAL_COMMUNITY): Payer: Self-pay | Admitting: Physician Assistant

## 2014-03-25 VITALS — BP 100/83 | HR 77 | Ht 63.0 in | Wt 138.0 lb

## 2014-03-25 DIAGNOSIS — F331 Major depressive disorder, recurrent, moderate: Secondary | ICD-10-CM

## 2014-03-25 DIAGNOSIS — F329 Major depressive disorder, single episode, unspecified: Secondary | ICD-10-CM

## 2014-03-25 DIAGNOSIS — F1210 Cannabis abuse, uncomplicated: Secondary | ICD-10-CM

## 2014-03-25 NOTE — Telephone Encounter (Signed)
Spoke with pt and informed  and confirmed with pt she has appt today at 4pm before the medication refill request for Lamictal could be refilled.  Pt states and shows understanding.

## 2014-03-25 NOTE — Telephone Encounter (Signed)
Do not refill the Lamictal at 150mg . I increased her dose to 200mg  on her last visit. Rona RavensNeil T. Niel Peretti RPAC 4:21 PM 03/25/2014

## 2014-03-25 NOTE — Progress Notes (Signed)
[facility] [id number] Progress Note  Sheila SleighLinda Beck [id number] 63 y.o.  03/25/2014 4:10 PM  Chief Complaint: Depression  History of Present Illness: Patient is in today to follow up on her depression. In the interim she was able to go to see her mother and sisters in [place] and states she has had a great time! She says the trip did her a world of good, and she is doing much much better. She reports even her daughter has noticed about her changes in mood as she is singing in the mornings now.  Sheila QuinLinda states she has stopped taking the Risperdal and the Hydroxyzine at bedtime because she was having trouble waking up in the mornings.    She denies SI/HI or AVH. She does report hearing "whispering in the morning when she moves her head too fast."  She is even able to talk constructively with her grandson and notes there is much less arguing now.  Suicidal Ideation: No Plan Formed: No Patient has means to carry out plan: No  Homicidal Ideation: No Plan Formed: No Patient has means to carry out plan: No  Review of Systems: Psychiatric: Anxiety: none! Agitation: Yes Hallucination: Yes just whispers Depressed Mood: Yes 1/10 Insomnia: No Hypersomnia: No Altered Concentration: No Feels Worthless: Yes Grandiose Ideas: No Belief In Special Powers: No New/Increased Substance Abuse: Yes smokes THC 3 x a month Compulsions: No  Neurologic: Headache: No Seizure: No Paresthesias: No  Past Medical Family, Social History: Sheila QuinLinda notes that she is feeling much better since seeing her sisters and being able to fish with her family. Outpatient Encounter Prescriptions as of 03/25/2014  Medication Sig  . Aclidinium Bromide (TUDORZA PRESSAIR) 400 MCG/ACT AEPB 1 inhalation twice a day.  . albuterol (VENTOLIN HFA) 108 (90 BASE) MCG/ACT inhaler Inhale 2 puffs into the lungs every 6 (six) hours as needed for wheezing or shortness of breath.  Marland Kitchen. buPROPion (WELLBUTRIN XL) 300 MG 24 hr tablet Take 1  tablet (300 mg total) by mouth daily.  Marland Kitchen. gabapentin (NEURONTIN) 300 MG capsule TAKE 1 CAPSULE THREE TIMES A DAY  . gemfibrozil (LOPID) 600 MG tablet TAKE 1 TABLET BY MOUTH TWICE A DAY  . hydrochlorothiazide (HYDRODIURIL) 25 MG tablet TAKE 1 TABLET BY MOUTH EVERY DAY  . ipratropium-albuterol (DUONEB) 0.5-2.5 (3) MG/3ML SOLN Take 3 mLs by nebulization every 4 (four) hours.  Marland Kitchen. lamoTRIgine (LAMICTAL) 200 MG tablet Take 1 tablet (200 mg total) by mouth daily.  . meloxicam (MOBIC) 15 MG tablet TAKE 1 TABLET EVERY MORNING FOR 2 WEEKS THEN TAKE 1 TABLET EVERY DAY AS NEEDED  . naltrexone (DEPADE) 50 MG tablet Take 1 tablet (50 mg total) by mouth daily.  Marland Kitchen. omeprazole (PRILOSEC) 20 MG capsule TAKE ONE CAPSULE EVERY DAY  . sertraline (ZOLOFT) 100 MG tablet TAKE 2 TABLETS BY MOUTH DAILY.  . hydrOXYzine (ATARAX/VISTARIL) 50 MG tablet Take 2 tablets (100 mg total) by mouth at bedtime as needed for anxiety.  . meloxicam (MOBIC) 15 MG tablet TAKE 1 TABLET EVERY MORNING FOR 2 WEEKS THEN TAKE 1 TABLET EVERY DAY AS NEEDED  . risperiDONE (RISPERDAL) 2 MG tablet Take 1 tablet (2 mg total) by mouth at bedtime.    Past Psychiatric History/Hospitalization(s): Anxiety: none! Bipolar Disorder: Yes Depression: Yes  1/10 much improved Mania: No Psychosis: Yes Schizophrenia: No Personality Disorder: No Hospitalization for psychiatric illness: Yes History of Electroconvulsive Shock Therapy: No Prior Suicide Attempts: Yes 2007 climbed up on a bridge and threatened to jump  Physical Exam: Constitutional:  BP 100/83  Pulse 77  Ht 5\' 3"  (1.6 m)  Wt 138 lb (62.596 kg)  BMI 24.45 kg/m2  General Appearance: alert, oriented, no acute distress patient looks 100% better than on last visit.  Musculoskeletal: Strength & Muscle Tone: within normal limits Gait & Station: normal Patient leans: N/A  Psychiatric: Speech (describe rate, volume, coherence, spontaneity, and abnormalities if any): normal  Thought Process  (describe rate, content, abstract reasoning, and computation): goal directed,coherent  Associations: Coherent and Relevant  Thoughts: normal  Mental Status: Orientation: oriented to time/date, day of week and month of year Mood & Affect: much brighter  Attention Span & Concentration: poor  Medical Decision Making (Choose Three): Established Problem, Stable/Improving (1) and Review of Psycho-Social Stressors (1) Assessment: Axis I: Depression much improved  Axis II:   Axis III:   Axis IV:   Axis V:    Plan:  1. Continued Lamictal to 200mg  po qd. 2. Discussed with the patient taking either 1/2 of the Risperdal dose and stopping the Hydroxyzine to avoid AM sedation, but cautioned about worsening psychosis. 3. Patient to follow up in 1 month. 4. Suggested out patient therapist. 5. Patient can call anytime she feels unsafe or worse.  [name], PA-C 03/25/2014

## 2014-03-25 NOTE — Telephone Encounter (Signed)
This patient needs to come in for a visit. Please do not refill this medication as I wanted her back in before a refill. Thanks,  Lloyd HugerNeil T. Dock Baccam

## 2014-04-08 ENCOUNTER — Ambulatory Visit: Payer: Medicare HMO | Admitting: Family Medicine

## 2014-04-21 ENCOUNTER — Encounter (HOSPITAL_COMMUNITY): Payer: Self-pay | Admitting: Physician Assistant

## 2014-04-23 ENCOUNTER — Ambulatory Visit (HOSPITAL_COMMUNITY): Payer: Medicare HMO | Admitting: Physician Assistant

## 2014-04-29 ENCOUNTER — Ambulatory Visit (INDEPENDENT_AMBULATORY_CARE_PROVIDER_SITE_OTHER): Payer: Medicare HMO | Admitting: Family Medicine

## 2014-04-29 ENCOUNTER — Encounter: Payer: Self-pay | Admitting: Family Medicine

## 2014-04-29 VITALS — BP 115/60 | HR 86 | Temp 98.3°F | Ht 63.0 in | Wt 141.0 lb

## 2014-04-29 DIAGNOSIS — I10 Essential (primary) hypertension: Secondary | ICD-10-CM

## 2014-04-29 DIAGNOSIS — E785 Hyperlipidemia, unspecified: Secondary | ICD-10-CM

## 2014-04-29 DIAGNOSIS — Z720 Tobacco use: Secondary | ICD-10-CM

## 2014-04-29 DIAGNOSIS — K219 Gastro-esophageal reflux disease without esophagitis: Secondary | ICD-10-CM

## 2014-04-29 DIAGNOSIS — J42 Unspecified chronic bronchitis: Secondary | ICD-10-CM

## 2014-04-29 MED ORDER — GEMFIBROZIL 600 MG PO TABS: ORAL_TABLET | ORAL | Status: DC

## 2014-04-29 MED ORDER — ACLIDINIUM BROMIDE 400 MCG/ACT IN AEPB: INHALATION_SPRAY | RESPIRATORY_TRACT | Status: DC

## 2014-04-29 MED ORDER — ALBUTEROL SULFATE HFA 108 (90 BASE) MCG/ACT IN AERS
2.0000 | INHALATION_SPRAY | Freq: Four times a day (QID) | RESPIRATORY_TRACT | Status: DC | PRN
Start: 2014-04-29 — End: 2014-12-02

## 2014-04-29 MED ORDER — OMEPRAZOLE 20 MG PO CPDR: DELAYED_RELEASE_CAPSULE | ORAL | Status: DC

## 2014-04-29 NOTE — Progress Notes (Signed)
   Subjective:    Patient ID: Sheila SleighLinda Yonts, female    DOB: 05/05/1951, 63 y.o.   MRN: [id number]  HPI Hypertension- Pt denies chest pain, SOB, dizziness, or heart palpitations.  Taking meds as directed w/o problems.  Denies medication side effects.    Hyperlipidemia - due to recheck lipds. Se is on gemfibrozil.    GERD- on prilosec 10mg  daily.   COPD - stilll smoking. Back up to 1ppd. No recent flares or execerbations. Using her [employer] regularly.   [place]. Her 62 yo mother is having a birthday.   Review of Systems     Objective:   Physical Exam  Constitutional: She is oriented to person, place, and time. She appears well-developed and well-nourished.  HENT:  Head: Normocephalic and atraumatic.  Cardiovascular: Normal rate, regular rhythm and normal heart sounds.   Pulmonary/Chest: Effort normal and breath sounds normal.  Neurological: She is alert and oriented to person, place, and time.  Skin: Skin is warm and dry.  Psychiatric: She has a normal mood and affect. Her behavior is normal.          Assessment & Plan:  Declined flu shot.   HTN- hasn't been on her medication for months and her BP looks great. Will continue to follow.   Hyperlipidemia -  Out of the gemfibrozil. Restart and then go for labs in one week.   COPD - stable. REfill meds today. Declied flu shot.   Tobacco abuse - encouraged cessation.  Make sure that may not

## 2014-04-30 ENCOUNTER — Ambulatory Visit (INDEPENDENT_AMBULATORY_CARE_PROVIDER_SITE_OTHER): Payer: Medicare HMO | Admitting: Physician Assistant

## 2014-04-30 ENCOUNTER — Encounter (HOSPITAL_COMMUNITY): Payer: Self-pay | Admitting: Physician Assistant

## 2014-04-30 ENCOUNTER — Other Ambulatory Visit (HOSPITAL_COMMUNITY): Payer: Self-pay | Admitting: Physician Assistant

## 2014-04-30 VITALS — BP 128/70 | HR 75 | Ht 63.0 in | Wt 135.0 lb

## 2014-04-30 DIAGNOSIS — F1210 Cannabis abuse, uncomplicated: Secondary | ICD-10-CM

## 2014-04-30 DIAGNOSIS — F331 Major depressive disorder, recurrent, moderate: Secondary | ICD-10-CM

## 2014-04-30 DIAGNOSIS — F1021 Alcohol dependence, in remission: Secondary | ICD-10-CM

## 2014-04-30 DIAGNOSIS — F3160 Bipolar disorder, current episode mixed, unspecified: Secondary | ICD-10-CM

## 2014-04-30 DIAGNOSIS — F329 Major depressive disorder, single episode, unspecified: Secondary | ICD-10-CM

## 2014-04-30 NOTE — Progress Notes (Signed)
[facility] [id number] Progress Note  Marti SleighLinda Lewin [id number] 63 y.o.  04/30/2014 5:41 PM  Chief Complaint: Depression  History of Present Illness: Patient notes that she is about the same as last time. Has some days with the blues but is able to talk herself out of it. She is sleeping ok, and her appetite seems to be slacking off again. She is anticipating going to [place] on the 30th of this month for her mother's 91st birthday on December 6th.   She denies SI/HI or AVH. She does report hearing the whispering, but she states it is not as prominent as it has been, and she is able to tune it out. She is even able to talk constructively with her grandson and notes there is much less arguing now.  Her grandson is reverting back to his old behaviors and this is forcing her to spend a great deal of time in her room. She does not want to try to change his behaviors at this point.  Her daughter's best friend is now also living with them. There seems to be a source of contention with her as she doesn't understand why he behaves this way.  Suicidal Ideation: No Plan Formed: No Patient has means to carry out plan: No  Homicidal Ideation: No Plan Formed: No Patient has means to carry out plan: No  Review of Systems: Psychiatric: Anxiety: 4/10 Agitation: Yes Hallucination: Yes just whispers Depressed Mood: Yes 5/10 Insomnia: No Hypersomnia: No Altered Concentration: No Feels Worthless: Yes Grandiose Ideas: No Belief In Special Powers: No New/Increased Substance Abuse: Yes smokes THC 3 x a month Compulsions: No  Neurologic: Headache: No Seizure: No Paresthesias: No  Past Medical Family, Social History: [name] notes that she is feeling much better since seeing her sisters and being able to fish with her family. Outpatient Encounter Prescriptions as of 03/25/2014  Medication Sig  . Aclidinium Bromide (TUDORZA PRESSAIR) 400 MCG/ACT AEPB 1 inhalation twice a day.  . albuterol  (VENTOLIN HFA) 108 (90 BASE) MCG/ACT inhaler Inhale 2 puffs into the lungs every 6 (six) hours as needed for wheezing or shortness of breath.  Marland Kitchen. buPROPion (WELLBUTRIN XL) 300 MG 24 hr tablet Take 1 tablet (300 mg total) by mouth daily.  Marland Kitchen. gabapentin (NEURONTIN) 300 MG capsule TAKE 1 CAPSULE THREE TIMES A DAY  . gemfibrozil (LOPID) 600 MG tablet TAKE 1 TABLET BY MOUTH TWICE A DAY  . hydrochlorothiazide (HYDRODIURIL) 25 MG tablet TAKE 1 TABLET BY MOUTH EVERY DAY  . ipratropium-albuterol (DUONEB) 0.5-2.5 (3) MG/3ML SOLN Take 3 mLs by nebulization every 4 (four) hours.  Marland Kitchen. lamoTRIgine (LAMICTAL) 200 MG tablet Take 1 tablet (200 mg total) by mouth daily.  . meloxicam (MOBIC) 15 MG tablet TAKE 1 TABLET EVERY MORNING FOR 2 WEEKS THEN TAKE 1 TABLET EVERY DAY AS NEEDED  . naltrexone (DEPADE) 50 MG tablet Take 1 tablet (50 mg total) by mouth daily.  Marland Kitchen. omeprazole (PRILOSEC) 20 MG capsule TAKE ONE CAPSULE EVERY DAY  . sertraline (ZOLOFT) 100 MG tablet TAKE 2 TABLETS BY MOUTH DAILY.  . hydrOXYzine (ATARAX/VISTARIL) 50 MG tablet Take 2 tablets (100 mg total) by mouth at bedtime as needed for anxiety.  . meloxicam (MOBIC) 15 MG tablet TAKE 1 TABLET EVERY MORNING FOR 2 WEEKS THEN TAKE 1 TABLET EVERY DAY AS NEEDED  . risperiDONE (RISPERDAL) 2 MG tablet Take 1 tablet (2 mg total) by mouth at bedtime.    Past Psychiatric History/Hospitalization(s): Anxiety: none! Bipolar Disorder: Yes Depression: Yes  1/10 much improved Mania: No Psychosis: Yes Schizophrenia: No Personality Disorder: No Hospitalization for psychiatric illness: Yes History of Electroconvulsive Shock Therapy: No Prior Suicide Attempts: Yes 2007 climbed up on a bridge and threatened to jump  Physical Exam: Constitutional:  BP 128/70 mmHg  Pulse 75  Ht 5\' 3"  (1.6 m)  Wt 135 lb (61.236 kg)  BMI 23.92 kg/m2  General Appearance: alert, oriented, no acute distress patient looks 100% better than on last visit.  Musculoskeletal: Strength &  Muscle Tone: within normal limits Gait & Station: normal Patient leans: N/A  Psychiatric: Speech (describe rate, volume, coherence, spontaneity, and abnormalities if any): normal  Thought Process (describe rate, content, abstract reasoning, and computation): goal directed,coherent  Associations: Coherent and Relevant  Thoughts: normal  Mental Status: Orientation: oriented to time/date, day of week and month of year Mood & Affect: much brighter  Attention Span & Concentration: poor  Medical Decision Making (Choose Three): Established Problem, Stable/Improving (1) and Review of Psycho-Social Stressors (1) Assessment: Axis I: Depression fair  Axis II:   Axis III:   Axis IV:   Axis V:    Plan: patient to call for refill prior to leaving for [place]. 1. Continue medications as directed. 2. Follow up upon return from [place]. Malcome Ambrocio, PA-C 04/30/2014

## 2014-05-10 LAB — COMPLETE METABOLIC PANEL WITH GFR
ALK PHOS: 80 U/L (ref 39–117)
ALT: 13 U/L (ref 0–35)
AST: 17 U/L (ref 0–37)
Albumin: 4.6 g/dL (ref 3.5–5.2)
BILIRUBIN TOTAL: 0.5 mg/dL (ref 0.2–1.2)
BUN: 14 mg/dL (ref 6–23)
CO2: 25 meq/L (ref 19–32)
CREATININE: 1 mg/dL (ref 0.50–1.10)
Calcium: 9.6 mg/dL (ref 8.4–10.5)
Chloride: 102 mEq/L (ref 96–112)
GFR, Est African American: 70 mL/min
GFR, Est Non African American: 61 mL/min
Glucose, Bld: 105 mg/dL — ABNORMAL HIGH (ref 70–99)
Potassium: 4 mEq/L (ref 3.5–5.3)
Sodium: 138 mEq/L (ref 135–145)
Total Protein: 7.5 g/dL (ref 6.0–8.3)

## 2014-05-10 LAB — LIPID PANEL
Cholesterol: 258 mg/dL — ABNORMAL HIGH (ref 0–200)
HDL: 42 mg/dL (ref >39)
LDL CALC: 181 mg/dL — AB (ref 0–99)
TRIGLYCERIDES: 177 mg/dL — AB (ref <150)
Total CHOL/HDL Ratio: 6.1 Ratio
VLDL: 35 mg/dL (ref 0–40)

## 2014-05-12 ENCOUNTER — Other Ambulatory Visit (HOSPITAL_COMMUNITY): Payer: Self-pay | Admitting: Physician Assistant

## 2014-05-14 ENCOUNTER — Telehealth (HOSPITAL_COMMUNITY): Payer: Self-pay | Admitting: *Deleted

## 2014-05-14 NOTE — Telephone Encounter (Signed)
[facility] called requesting a refill for Lamictal 200mg . Per Verne SpurrNeil Mashburn, PA-C, pt is authorized 1 refill for Lamictal 200mg , Qty 30. Called and inform pt prescription will be available for pickup on 11/25. Pt states and shows understanding.

## 2014-06-01 ENCOUNTER — Other Ambulatory Visit: Payer: Self-pay | Admitting: Family Medicine

## 2014-06-03 ENCOUNTER — Ambulatory Visit (INDEPENDENT_AMBULATORY_CARE_PROVIDER_SITE_OTHER): Payer: Medicare HMO | Admitting: Physician Assistant

## 2014-06-03 ENCOUNTER — Other Ambulatory Visit (HOSPITAL_COMMUNITY): Payer: Self-pay | Admitting: Physician Assistant

## 2014-06-03 VITALS — BP 131/67 | HR 65 | Ht 63.0 in | Wt 140.0 lb

## 2014-06-03 DIAGNOSIS — F331 Major depressive disorder, recurrent, moderate: Secondary | ICD-10-CM

## 2014-06-03 DIAGNOSIS — F1021 Alcohol dependence, in remission: Secondary | ICD-10-CM

## 2014-06-03 DIAGNOSIS — F1414 Cocaine abuse with cocaine-induced mood disorder: Secondary | ICD-10-CM

## 2014-06-03 DIAGNOSIS — F329 Major depressive disorder, single episode, unspecified: Secondary | ICD-10-CM

## 2014-06-03 DIAGNOSIS — F1020 Alcohol dependence, uncomplicated: Secondary | ICD-10-CM

## 2014-06-03 DIAGNOSIS — F3160 Bipolar disorder, current episode mixed, unspecified: Secondary | ICD-10-CM

## 2014-06-03 DIAGNOSIS — F1210 Cannabis abuse, uncomplicated: Secondary | ICD-10-CM

## 2014-06-03 DIAGNOSIS — F4321 Adjustment disorder with depressed mood: Secondary | ICD-10-CM

## 2014-06-03 MED ORDER — BUPROPION HCL ER (XL) 300 MG PO TB24: 300.0000 mg | ORAL_TABLET | Freq: Every day | ORAL | Status: DC

## 2014-06-03 MED ORDER — LAMOTRIGINE 200 MG PO TABS: 200.0000 mg | ORAL_TABLET | Freq: Every day | ORAL | Status: DC

## 2014-06-03 MED ORDER — RISPERIDONE 2 MG PO TABS: 2.0000 mg | ORAL_TABLET | Freq: Every day | ORAL | Status: DC

## 2014-06-03 MED ORDER — GABAPENTIN 300 MG PO CAPS: ORAL_CAPSULE | ORAL | Status: DC

## 2014-06-03 MED ORDER — SERTRALINE HCL 100 MG PO TABS: ORAL_TABLET | ORAL | Status: DC

## 2014-06-03 NOTE — Progress Notes (Signed)
[facility] [id number] Progress Note  Sheila SleighLinda Beck [id number] 63 y.o.  06/03/2014 4:17 PM  Chief Complaint: Depression  History of Present Illness: Sheila Beck had just returned from her visit in [place] which went well. Her oldest brother passed away unexpectedly from a surgical complication during her return trip and now she is grieving this.  She is appropriately grieving, but does note that her grandson and her are doing really well, as he changed a lot since she went to [place].  Suicidal Ideation: No Plan Formed: No Patient has means to carry out plan: No  Homicidal Ideation: No Plan Formed: No Patient has means to carry out plan: No  Review of Systems: Psychiatric: Anxiety:  4/10 Agitation: Yes Hallucination: Yes just whispers Depressed Mood: Yes 8-9/10 Insomnia: No Hypersomnia: No Altered Concentration: No Feels Worthless: Yes Grandiose Ideas: No Belief In Special Powers: No New/Increased Substance Abuse: Yes smokes THC 3 x a month Compulsions: No  Neurologic: Headache: No Seizure: No Paresthesias: No  Past Medical Family, Social History: Sheila Beck notes that she is feeling much better since seeing her sisters and being able to fish with her family. Outpatient Encounter Prescriptions as of 03/25/2014  Medication Sig  . Aclidinium Bromide (TUDORZA PRESSAIR) 400 MCG/ACT AEPB 1 inhalation twice a day.  . albuterol (VENTOLIN HFA) 108 (90 BASE) MCG/ACT inhaler Inhale 2 puffs into the lungs every 6 (six) hours as needed for wheezing or shortness of breath.  Marland Kitchen. buPROPion (WELLBUTRIN XL) 300 MG 24 hr tablet Take 1 tablet (300 mg total) by mouth daily.  Marland Kitchen. gabapentin (NEURONTIN) 300 MG capsule TAKE 1 CAPSULE THREE TIMES A DAY  . gemfibrozil (LOPID) 600 MG tablet TAKE 1 TABLET BY MOUTH TWICE A DAY  . hydrochlorothiazide (HYDRODIURIL) 25 MG tablet TAKE 1 TABLET BY MOUTH EVERY DAY  . ipratropium-albuterol (DUONEB) 0.5-2.5 (3) MG/3ML SOLN Take 3 mLs by nebulization every 4 (four)  hours.  Marland Kitchen. lamoTRIgine (LAMICTAL) 200 MG tablet Take 1 tablet (200 mg total) by mouth daily.  . meloxicam (MOBIC) 15 MG tablet TAKE 1 TABLET EVERY MORNING FOR 2 WEEKS THEN TAKE 1 TABLET EVERY DAY AS NEEDED  . naltrexone (DEPADE) 50 MG tablet Take 1 tablet (50 mg total) by mouth daily.  Marland Kitchen. omeprazole (PRILOSEC) 20 MG capsule TAKE ONE CAPSULE EVERY DAY  . sertraline (ZOLOFT) 100 MG tablet TAKE 2 TABLETS BY MOUTH DAILY.  . hydrOXYzine (ATARAX/VISTARIL) 50 MG tablet Take 2 tablets (100 mg total) by mouth at bedtime as needed for anxiety.  . meloxicam (MOBIC) 15 MG tablet TAKE 1 TABLET EVERY MORNING FOR 2 WEEKS THEN TAKE 1 TABLET EVERY DAY AS NEEDED  . risperiDONE (RISPERDAL) 2 MG tablet Take 1 tablet (2 mg total) by mouth at bedtime.    Past Psychiatric History/Hospitalization(s): Anxiety: none! Bipolar Disorder: Yes Depression: Yes  Now complicated by grief and loss Mania: No Psychosis: Yes Schizophrenia: No Personality Disorder: No Hospitalization for psychiatric illness: Yes History of Electroconvulsive Shock Therapy: No Prior Suicide Attempts: Yes 2007 climbed up on a bridge and threatened to jump  Physical Exam: Constitutional:  There were no vitals taken for this visit.  General Appearance: alert, oriented, no acute distress patient looks 100% better than on last visit.  Musculoskeletal: Strength & Muscle Tone: within normal limits Gait & Station: normal Patient leans: N/A  Psychiatric: Speech (describe rate, volume, coherence, spontaneity, and abnormalities if any): normal  Thought Process (describe rate, content, abstract reasoning, and computation): goal directed,coherent  Associations: Coherent and Relevant  Thoughts: normal  Mental Status: Orientation: oriented to time/date, day of week and month of year Mood & Affect: much brighter  Attention Span & Concentration: poor  Medical Decision Making (Choose Three): Established Problem, Stable/Improving (1) and Review  of Psycho-Social Stressors (1) Assessment: Axis I: Depression now grief.   Plan: 1. Will    Sheila Michelin, PA-C 06/03/2014

## 2014-06-03 NOTE — Patient Instructions (Signed)
1. Take medications as ordered. 2. Follow up in 3 months. 3. Call this office for questions or problems.

## 2014-06-04 ENCOUNTER — Telehealth (HOSPITAL_COMMUNITY): Payer: Self-pay | Admitting: *Deleted

## 2014-06-04 DIAGNOSIS — F3160 Bipolar disorder, current episode mixed, unspecified: Secondary | ICD-10-CM

## 2014-06-04 DIAGNOSIS — F1020 Alcohol dependence, uncomplicated: Secondary | ICD-10-CM

## 2014-06-04 MED ORDER — NALTREXONE HCL 50 MG PO TABS: 50.0000 mg | ORAL_TABLET | Freq: Every day | ORAL | Status: DC

## 2014-06-04 NOTE — Telephone Encounter (Signed)
[facility] [place], per Sheila Mashburn, PA-C, pt is authorized for 1 refill  For Naltrexone 50mg , Qty 30. Called and informed pt, prescription will be ready for pick up on 12/16. Pt states and shows understanding. Pt has a  Follow up appt on 09/02/14.

## 2014-06-09 ENCOUNTER — Other Ambulatory Visit (HOSPITAL_COMMUNITY): Payer: Self-pay | Admitting: Physician Assistant

## 2014-06-24 ENCOUNTER — Other Ambulatory Visit (HOSPITAL_COMMUNITY): Payer: Self-pay | Admitting: Physician Assistant

## 2014-06-24 DIAGNOSIS — F1020 Alcohol dependence, uncomplicated: Secondary | ICD-10-CM

## 2014-06-24 DIAGNOSIS — F3160 Bipolar disorder, current episode mixed, unspecified: Secondary | ICD-10-CM

## 2014-06-25 NOTE — Telephone Encounter (Signed)
Spoke with Morrie SheldonAshley from [facility]. Per Verne SpurrNeil Mashburn, PA-C, pt is authorized for 1 refill of Naltrexone 50mg , Qty 30. Pt has appt on 09/02/14.

## 2014-07-17 ENCOUNTER — Other Ambulatory Visit: Payer: Self-pay | Admitting: Family Medicine

## 2014-07-17 ENCOUNTER — Other Ambulatory Visit (HOSPITAL_COMMUNITY): Payer: Self-pay | Admitting: *Deleted

## 2014-07-17 NOTE — Telephone Encounter (Signed)
Pt needs Rx for Meloxicam, Gemfibrozil, and Lamictal. Please call pt once refilled at [phone].

## 2014-07-17 NOTE — Telephone Encounter (Signed)
Pt called for refills for Meloxicam 15mg , Lamictal 200mg  and Gemfibrozil 600mg .  Informed pt, she would have to contact her PCP for a refill for Gemfibrozil 600mg . Per Verne SpurrNeil Mashburn, PA-C, pt is authorized for a refill for Meloxicam 15mg , Qty 30 w/ 0 additional refills and Lamictal 200mg , Qty 30, w/ 1 additional refill. Pt has f/u appt with Lloyd HugerNeil on 3/15. Called and informed pt of prescription status. Pt verbally shows understanding.

## 2014-07-17 NOTE — Telephone Encounter (Deleted)
Spoke with Morrie [facility]ld[facility]Ashley at [facility]([place]  Per Verne SpurrNeil Mashburn, PA-C, pt is authorized for 1 refills for Meloxicam 15mg , Qty 30  and Lamictal 200mg , Qty 30, w/1 additional refill were phoned into pharmacy. Pt has a

## 2014-08-01 ENCOUNTER — Other Ambulatory Visit (HOSPITAL_COMMUNITY): Payer: Self-pay | Admitting: *Deleted

## 2014-08-01 DIAGNOSIS — F3160 Bipolar disorder, current episode mixed, unspecified: Secondary | ICD-10-CM

## 2014-08-01 DIAGNOSIS — F1020 Alcohol dependence, uncomplicated: Secondary | ICD-10-CM

## 2014-08-01 MED ORDER — NALTREXONE HCL 50 MG PO TABS: 50.0000 mg | ORAL_TABLET | Freq: Every day | ORAL | Status: DC

## 2014-08-01 NOTE — Telephone Encounter (Signed)
[facility]ill for Naltrexone 50mg . Per Verne SpurrNeil Mashburn, PA-C, pt is authorized for a refill for Naltrexone 50mg  w/ 1 additional refill Qty 30 Pt has f/u appt on 3/15. Pharmacy will notify pt of refill status.

## 2014-08-21 ENCOUNTER — Telehealth: Payer: Self-pay | Admitting: *Deleted

## 2014-08-21 ENCOUNTER — Other Ambulatory Visit: Payer: Self-pay | Admitting: Family Medicine

## 2014-08-21 ENCOUNTER — Other Ambulatory Visit: Payer: Self-pay | Admitting: *Deleted

## 2014-08-21 DIAGNOSIS — N183 Chronic kidney disease, stage 3 unspecified: Secondary | ICD-10-CM

## 2014-08-21 DIAGNOSIS — I10 Essential (primary) hypertension: Secondary | ICD-10-CM

## 2014-08-21 DIAGNOSIS — E785 Hyperlipidemia, unspecified: Secondary | ICD-10-CM

## 2014-08-21 MED ORDER — GEMFIBROZIL 600 MG PO TABS: 600.0000 mg | ORAL_TABLET | Freq: Two times a day (BID) | ORAL | Status: DC

## 2014-08-21 NOTE — Telephone Encounter (Signed)
Pt advised that Dr. Linford ArnoldMetheney wanted her to have labs rechecked due to elevated levels. Pt informed that she will need to be fasting and that the lab order will be faxed downstairs. She was also reminded of the recommendation of a statin being added to her regimen she said that she is ok with this. Will fwd to Dr. Linford ArnoldMetheney for f/u.Loralee PacasBarkley, Yusuf Yu [place]

## 2014-08-22 MED ORDER — ATORVASTATIN CALCIUM 40 MG PO TABS: 40.0000 mg | ORAL_TABLET | Freq: Every day | ORAL | Status: DC

## 2014-08-22 NOTE — Telephone Encounter (Signed)
lipitor script sent.

## 2014-08-23 ENCOUNTER — Other Ambulatory Visit (HOSPITAL_COMMUNITY): Payer: Self-pay | Admitting: Physician Assistant

## 2014-09-02 ENCOUNTER — Encounter (HOSPITAL_COMMUNITY): Payer: Self-pay | Admitting: Physician Assistant

## 2014-09-02 ENCOUNTER — Ambulatory Visit (INDEPENDENT_AMBULATORY_CARE_PROVIDER_SITE_OTHER): Payer: Medicare HMO | Admitting: Physician Assistant

## 2014-09-02 ENCOUNTER — Ambulatory Visit (INDEPENDENT_AMBULATORY_CARE_PROVIDER_SITE_OTHER): Payer: Commercial Managed Care - HMO | Admitting: Family Medicine

## 2014-09-02 ENCOUNTER — Encounter: Payer: Self-pay | Admitting: Family Medicine

## 2014-09-02 VITALS — BP 125/71 | HR 93 | Wt 140.0 lb

## 2014-09-02 VITALS — BP 125/71 | HR 93 | Ht 63.0 in | Wt 140.0 lb

## 2014-09-02 DIAGNOSIS — F329 Major depressive disorder, single episode, unspecified: Secondary | ICD-10-CM

## 2014-09-02 DIAGNOSIS — F1210 Cannabis abuse, uncomplicated: Secondary | ICD-10-CM

## 2014-09-02 DIAGNOSIS — F3160 Bipolar disorder, current episode mixed, unspecified: Secondary | ICD-10-CM

## 2014-09-02 DIAGNOSIS — F1020 Alcohol dependence, uncomplicated: Secondary | ICD-10-CM

## 2014-09-02 DIAGNOSIS — F1021 Alcohol dependence, in remission: Secondary | ICD-10-CM

## 2014-09-02 DIAGNOSIS — F1414 Cocaine abuse with cocaine-induced mood disorder: Secondary | ICD-10-CM

## 2014-09-02 DIAGNOSIS — M542 Cervicalgia: Secondary | ICD-10-CM

## 2014-09-02 DIAGNOSIS — F331 Major depressive disorder, recurrent, moderate: Secondary | ICD-10-CM

## 2014-09-02 DIAGNOSIS — F32A Depression, unspecified: Secondary | ICD-10-CM

## 2014-09-02 DIAGNOSIS — E785 Hyperlipidemia, unspecified: Secondary | ICD-10-CM

## 2014-09-02 LAB — LIPID PANEL
CHOLESTEROL: 161 mg/dL (ref 0–200)
HDL: 46 mg/dL (ref >=46)
LDL Cholesterol: 92 mg/dL (ref 0–99)
Total CHOL/HDL Ratio: 3.5 Ratio
Triglycerides: 113 mg/dL (ref <150)
VLDL: 23 mg/dL (ref 0–40)

## 2014-09-02 LAB — BASIC METABOLIC PANEL WITH GFR
BUN: 15 mg/dL (ref 6–23)
CHLORIDE: 106 meq/L (ref 96–112)
CO2: 28 mEq/L (ref 19–32)
Calcium: 9.1 mg/dL (ref 8.4–10.5)
Creat: 1.02 mg/dL (ref 0.50–1.10)
GFR, Est African American: 68 mL/min
GFR, Est Non African American: 59 mL/min — ABNORMAL LOW
Glucose, Bld: 94 mg/dL (ref 70–99)
POTASSIUM: 4.7 meq/L (ref 3.5–5.3)
Sodium: 144 mEq/L (ref 135–145)

## 2014-09-02 MED ORDER — SERTRALINE HCL 100 MG PO TABS: ORAL_TABLET | ORAL | Status: DC

## 2014-09-02 MED ORDER — NALTREXONE HCL 50 MG PO TABS: 50.0000 mg | ORAL_TABLET | Freq: Every day | ORAL | Status: DC

## 2014-09-02 MED ORDER — LAMOTRIGINE 200 MG PO TABS: 200.0000 mg | ORAL_TABLET | Freq: Every day | ORAL | Status: DC

## 2014-09-02 MED ORDER — GABAPENTIN 300 MG PO CAPS: ORAL_CAPSULE | ORAL | Status: DC

## 2014-09-02 MED ORDER — BUPROPION HCL ER (XL) 300 MG PO TB24: 300.0000 mg | ORAL_TABLET | Freq: Every day | ORAL | Status: DC

## 2014-09-02 MED ORDER — RISPERIDONE 2 MG PO TABS: 2.0000 mg | ORAL_TABLET | Freq: Every day | ORAL | Status: DC

## 2014-09-02 NOTE — Progress Notes (Signed)
Quick Note:  All labs are normal. ______ 

## 2014-09-02 NOTE — Patient Instructions (Signed)
1. Continue all medication as ordered. 2. Call this office if you have any questions or concerns. 3. Continue to get regular exercise 3-5 times a week. 4. Continue to eat a healthy nutritionally balanced diet. 5. Continue to reduce stress and anxiety through activities such as yoga, mindfulness, meditation and or prayer. 6. Keep all appointments with your out patient therapist and have notes forwarded to this office. (If you do not have one and would like to be scheduled with a therapist, please let our office assist you with this. 7. Follow up as planned 3-4 weeks. 

## 2014-09-02 NOTE — Progress Notes (Signed)
   Subjective:    Patient ID: Sheila SleighLinda Gulla, female    DOB: 09/25/1950, 64 y.o.   MRN: [id number]  HPI Noticed a lump on the right side of her neck. it was sore and tender. Painful to turn her head. NO fever with it. It lasted for about 2 weeks.  She says it finally went away.   Hyperlipidemia - she wants to know if can take the stain and the lopid together. Has been taking her Lipitor in the AM. She just went for blood work yesterday.  Depression - Seeing Dr. Dalene SeltzerMashburn today. She brought in all her medications so that she could make sure she knew what she was taking. She feels like she's been more depressed lately..     Review of Systems     Objective:   Physical Exam  Constitutional: She is oriented to person, place, and time. She appears well-developed and well-nourished.  HENT:  Head: Normocephalic and atraumatic.  Neck: Neck supple. No thyromegaly present.  Cardiovascular: Normal rate, regular rhythm and normal heart sounds.   Pulmonary/Chest: Effort normal and breath sounds normal.  Lymphadenopathy:    She has no cervical adenopathy.  Neurological: She is alert and oriented to person, place, and time.  Skin: Skin is warm and dry.  Psychiatric: She has a normal mood and affect. Her behavior is normal.          Assessment & Plan:  Lump on the right side of neck-C resolved on its own. I suspect it was probably just a swollen lymph node. Next  Hyperlipidemia-we discussed that the new guidelines recommend focusing on statins which reduce risk of heart attack and stroke. So we will go ahead and have her stop the Lopid which can increase risk of liver enzymes in addition to taking a statin. We can then recheck her cluster levels in 6 months. They look fantastic on check yesterday. Next  Depression, worsening-has appointment with her mental health provider today.
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
## 2014-09-02 NOTE — Patient Instructions (Signed)
Ok to stop the Lopid once finish the bottle.

## 2014-09-03 ENCOUNTER — Other Ambulatory Visit (HOSPITAL_COMMUNITY): Payer: Self-pay | Admitting: *Deleted

## 2014-09-03 NOTE — Telephone Encounter (Signed)
Received fax from pharmacy for a refill for Meloxicam.  Called pharmacy. Pt will have to contact her PCP for a refill.

## 2014-09-08 ENCOUNTER — Other Ambulatory Visit: Payer: Self-pay | Admitting: *Deleted

## 2014-09-08 MED ORDER — OMEPRAZOLE 20 MG PO CPDR: DELAYED_RELEASE_CAPSULE | ORAL | Status: DC

## 2014-09-23 ENCOUNTER — Ambulatory Visit (INDEPENDENT_AMBULATORY_CARE_PROVIDER_SITE_OTHER): Payer: Medicare HMO | Admitting: Physician Assistant

## 2014-09-23 ENCOUNTER — Encounter (HOSPITAL_COMMUNITY): Payer: Self-pay | Admitting: Physician Assistant

## 2014-09-23 VITALS — BP 123/65 | HR 76 | Ht 63.0 in | Wt 135.0 lb

## 2014-09-23 DIAGNOSIS — F333 Major depressive disorder, recurrent, severe with psychotic symptoms: Secondary | ICD-10-CM | POA: Diagnosis not present

## 2014-09-23 DIAGNOSIS — F3160 Bipolar disorder, current episode mixed, unspecified: Secondary | ICD-10-CM

## 2014-09-23 DIAGNOSIS — F1414 Cocaine abuse with cocaine-induced mood disorder: Secondary | ICD-10-CM

## 2014-09-23 DIAGNOSIS — F1210 Cannabis abuse, uncomplicated: Secondary | ICD-10-CM

## 2014-09-23 DIAGNOSIS — F331 Major depressive disorder, recurrent, moderate: Secondary | ICD-10-CM

## 2014-09-23 DIAGNOSIS — F1021 Alcohol dependence, in remission: Secondary | ICD-10-CM

## 2014-09-23 DIAGNOSIS — F1020 Alcohol dependence, uncomplicated: Secondary | ICD-10-CM

## 2014-09-23 MED ORDER — NALTREXONE HCL 50 MG PO TABS: 50.0000 mg | ORAL_TABLET | Freq: Every day | ORAL | Status: DC

## 2014-09-23 NOTE — Progress Notes (Signed)
[facility] [id number] Progress Note  Sheila Beck [id number] 64 y.o.  09/23/2014 4:22 PM  Chief Complaint: Depression  History of Present Illness: Patient is in today to follow up on her depression. She has recently increased her Wellbutrin to 300xl on there visit 2 weeks ago. She notes that she is not crying anymore, but she just wants to be left alone. She continues to worry about her mother's health and misses being with her.  She is hoping to move into government housing apartment and to be left alone.  Suicidal Ideation: No Plan Formed: No Patient has means to carry out plan: No  Homicidal Ideation: No Plan Formed: No Patient has means to carry out plan: No  Review of Systems: Psychiatric: Anxiety:  8/10 Agitation: Yes Hallucination: Yes just whispers Depressed Mood: Yes 12/10 Insomnia: No Hypersomnia: No Altered Concentration: No Feels Worthless: Yes Grandiose Ideas: No Belief In Special Powers: No New/Increased Substance Abuse: Yes smokes THC 3 x a month Compulsions: No  Neurologic: Headache: No Seizure: No Paresthesias: No  Past Medical Family, Social History: She states she has no visitors and has no friends.  Outpatient Encounter Prescriptions as of 03/25/2014  Medication Sig  . Aclidinium Bromide (TUDORZA PRESSAIR) 400 MCG/ACT AEPB 1 inhalation twice a day.  . albuterol (VENTOLIN HFA) 108 (90 BASE) MCG/ACT inhaler Inhale 2 puffs into the lungs every 6 (six) hours as needed for wheezing or shortness of breath.  Marland Kitchen buPROPion (WELLBUTRIN XL) 300 MG 24 hr tablet Take 1 tablet (300 mg total) by mouth daily.  Marland Kitchen gabapentin (NEURONTIN) 300 MG capsule TAKE 1 CAPSULE THREE TIMES A DAY  . gemfibrozil (LOPID) 600 MG tablet TAKE 1 TABLET BY MOUTH TWICE A DAY  . hydrochlorothiazide (HYDRODIURIL) 25 MG tablet TAKE 1 TABLET BY MOUTH EVERY DAY  . ipratropium-albuterol (DUONEB) 0.5-2.5 (3) MG/3ML SOLN Take 3 mLs by nebulization every 4 (four) hours.  Marland Kitchen lamoTRIgine  (LAMICTAL) 200 MG tablet Take 1 tablet (200 mg total) by mouth daily.  . meloxicam (MOBIC) 15 MG tablet TAKE 1 TABLET EVERY MORNING FOR 2 WEEKS THEN TAKE 1 TABLET EVERY DAY AS NEEDED  . naltrexone (DEPADE) 50 MG tablet Take 1 tablet (50 mg total) by mouth daily.  Marland Kitchen omeprazole (PRILOSEC) 20 MG capsule TAKE ONE CAPSULE EVERY DAY  . sertraline (ZOLOFT) 100 MG tablet TAKE 2 TABLETS BY MOUTH DAILY.  . hydrOXYzine (ATARAX/VISTARIL) 50 MG tablet Take 2 tablets (100 mg total) by mouth at bedtime as needed for anxiety.  . meloxicam (MOBIC) 15 MG tablet TAKE 1 TABLET EVERY MORNING FOR 2 WEEKS THEN TAKE 1 TABLET EVERY DAY AS NEEDED  . risperiDONE (RISPERDAL) 2 MG tablet Take 1 tablet (2 mg total) by mouth at bedtime.    Past Psychiatric History/Hospitalization(s): Anxiety: none! Bipolar Disorder: Yes Depression: Yes   Mania: No Psychosis: Yes Schizophrenia: No Personality Disorder: No Hospitalization for psychiatric illness: Yes History of Electroconvulsive Shock Therapy: No Prior Suicide Attempts: Yes 2007 climbed up on a bridge and threatened to jump  Physical Exam: Constitutional:  BP 123/65 mmHg  Pulse 76  Ht  (1.6 m)  Wt 135 lb (61.236 kg)  BMI 23.92 kg/m2  General Appearance: alert, oriented, no acute distress patient looks 100% better than on last visit.  Musculoskeletal: Strength & Muscle Tone: within normal limits Gait & Station: normal Patient leans: N/A  Psychiatric: Speech (describe rate, volume, coherence, spontaneity, and abnormalities if any): normal  Thought Process (describe rate, content, abstract reasoning, and computation): goal  directed,coherent  Associations: Coherent and Relevant  Thoughts: normal  Mental Status: Orientation: oriented to time/date, day of week and month of year Mood & Affect: much brighter  Attention Span & Concentration: poor  Medical Decision Making (Choose Three): Established Problem, Stable/Improving (1) and Review of  Psycho-Social Stressors (1) Assessment: Axis I: MDD recurrent with psychotic features. Plan: 1. Will continue to use medication as written. 2. Patient declines the need for hospitalization, just wants to be left alone. 3. Follow up in 1 month. 4. Patient continues to refuse therapy.    Dynesha Woolen, PA-C 09/23/2014

## 2014-09-23 NOTE — Patient Instructions (Signed)
1. Continue all medication as ordered. 2. Call this office if you have any questions or concerns. 3. Continue to get regular exercise 3-5 times a week. 4. Continue to eat a healthy nutritionally balanced diet. 5. Continue to reduce stress and anxiety through activities such as yoga, mindfulness, meditation and or prayer. 6. Keep all appointments with your out patient therapist and have notes forwarded to this office. (If you do not have one and would like to be scheduled with a therapist, please let our office assist you with this. 7. Follow up as planned 1 month. 

## 2014-09-26 ENCOUNTER — Telehealth (HOSPITAL_COMMUNITY): Payer: Self-pay | Admitting: *Deleted

## 2014-09-26 DIAGNOSIS — F3160 Bipolar disorder, current episode mixed, unspecified: Secondary | ICD-10-CM

## 2014-09-26 DIAGNOSIS — F1020 Alcohol dependence, uncomplicated: Secondary | ICD-10-CM

## 2014-09-26 MED ORDER — NALTREXONE HCL 50 MG PO TABS: 50.0000 mg | ORAL_TABLET | Freq: Every day | ORAL | Status: DC

## 2014-09-26 NOTE — Telephone Encounter (Signed)
Received fax from pharmacy for a refill for Naltrexone 50mg . Per Janey GreaserNeil Mashburn,PA-C, pt is authorized for a refill for Naltrexone 50mg , Qty 30 w/ 1X. Prescription was sent to pharmacy. Called and informed pt of prescription status. Pt has f/u appt on 5/3. Pt states and show understanding.

## 2014-10-01 ENCOUNTER — Other Ambulatory Visit: Payer: Self-pay | Admitting: Family Medicine

## 2014-10-21 ENCOUNTER — Ambulatory Visit (INDEPENDENT_AMBULATORY_CARE_PROVIDER_SITE_OTHER): Payer: Medicare HMO | Admitting: Physician Assistant

## 2014-10-21 VITALS — BP 114/60 | HR 67 | Ht 63.0 in | Wt 135.0 lb

## 2014-10-21 DIAGNOSIS — S6010XA Contusion of unspecified finger with damage to nail, initial encounter: Secondary | ICD-10-CM

## 2014-10-21 DIAGNOSIS — F331 Major depressive disorder, recurrent, moderate: Secondary | ICD-10-CM

## 2014-10-21 DIAGNOSIS — F3160 Bipolar disorder, current episode mixed, unspecified: Secondary | ICD-10-CM

## 2014-10-21 NOTE — Progress Notes (Signed)
[facility] [id number] Progress Note  Ernst BowlerLinda S Larmore [id number] 64 y.o.  10/21/2014 4:41 PM  Chief Complaint: Depression  History of Present Illness: Patient notes that she is about the same, but not any worse. She did slam her finger in the car door 45 minutes ago. She is now on the top of the list for HUD placement, and she has found an apartment and states when she gets out on her own things will be much better.  Patient notes that she is not as "blue" as she was, sleeping ok, appetite ok, but notes weight loss without trying. She has been consistent over the past two or 3 visits.  She is followed by Dr. Eppie GibsonMetheny.   Suicidal Ideation: No Plan Formed: No Patient has means to carry out plan: No  Homicidal Ideation: No Plan Formed: No Patient has means to carry out plan: No  Review of Systems: Psychiatric: Anxiety:  8/10 Agitation: Yes Hallucination: Yes just whispers Depressed Mood: Yes 12/10 Insomnia: No Hypersomnia: No Altered Concentration: No Feels Worthless: Yes Grandiose Ideas: No Belief In Special Powers: No New/Increased Substance Abuse: Yes smokes THC 3 x a month Compulsions: No  Neurologic: Headache: No Seizure: No Paresthesias: No  Past Medical Family, Social History: She states she has no visitors and has no friends.  Outpatient Encounter Prescriptions as of 03/25/2014  Medication Sig  . Aclidinium Bromide (TUDORZA PRESSAIR) 400 MCG/ACT AEPB 1 inhalation twice a day.  . albuterol (VENTOLIN HFA) 108 (90 BASE) MCG/ACT inhaler Inhale 2 puffs into the lungs every 6 (six) hours as needed for wheezing or shortness of breath.  Marland Kitchen. buPROPion (WELLBUTRIN XL) 300 MG 24 hr tablet Take 1 tablet (300 mg total) by mouth daily.  Marland Kitchen. gabapentin (NEURONTIN) 300 MG capsule TAKE 1 CAPSULE THREE TIMES A DAY  . gemfibrozil (LOPID) 600 MG tablet TAKE 1 TABLET BY MOUTH TWICE A DAY  . hydrochlorothiazide (HYDRODIURIL) 25 MG tablet TAKE 1 TABLET BY MOUTH EVERY DAY  .  ipratropium-albuterol (DUONEB) 0.5-2.5 (3) MG/3ML SOLN Take 3 mLs by nebulization every 4 (four) hours.  Marland Kitchen. lamoTRIgine (LAMICTAL) 200 MG tablet Take 1 tablet (200 mg total) by mouth daily.  . meloxicam (MOBIC) 15 MG tablet TAKE 1 TABLET EVERY MORNING FOR 2 WEEKS THEN TAKE 1 TABLET EVERY DAY AS NEEDED  . naltrexone (DEPADE) 50 MG tablet Take 1 tablet (50 mg total) by mouth daily.  Marland Kitchen. omeprazole (PRILOSEC) 20 MG capsule TAKE ONE CAPSULE EVERY DAY  . sertraline (ZOLOFT) 100 MG tablet TAKE 2 TABLETS BY MOUTH DAILY.  . hydrOXYzine (ATARAX/VISTARIL) 50 MG tablet Take 2 tablets (100 mg total) by mouth at bedtime as needed for anxiety.  . meloxicam (MOBIC) 15 MG tablet TAKE 1 TABLET EVERY MORNING FOR 2 WEEKS THEN TAKE 1 TABLET EVERY DAY AS NEEDED  . risperiDONE (RISPERDAL) 2 MG tablet Take 1 tablet (2 mg total) by mouth at bedtime.    Past Psychiatric History/Hospitalization(s): Anxiety: none! Bipolar Disorder: Yes Depression: Yes   Mania: No Psychosis: Yes Schizophrenia: No Personality Disorder: No Hospitalization for psychiatric illness: Yes History of Electroconvulsive Shock Therapy: No Prior Suicide Attempts: Yes 2007 climbed up on a bridge and threatened to jump  Physical Exam: Constitutional:  BP 114/60 mmHg  Pulse 67  Ht 5\' 3"  (1.6 m)  Wt 135 lb (61.236 kg)  BMI 23.92 kg/m2  SpO2 93%  General Appearance: alert, oriented, no acute distress patient looks 100% better than on last visit.  Total Time spent with patient: 30  minutes  Psychiatric Specialty Exam: Physical Exam  ROS  Blood pressure 114/60, pulse 67, height  (1.6 m), weight 135 lb (61.236 kg), SpO2 93 %.Body mass index is 23.92 kg/(m^2).  General Appearance: Fairly Groomed  Patent attorney::  Good  Speech:  Clear and Coherent  Volume:  Normal  Mood:  Anxious and Depressed  Affect:  Congruent  Thought Process:  Coherent, Goal Directed, Linear and Logical  Orientation:  Full (Time, Place, and Person)  Thought  Content:  WDL  Suicidal Thoughts:  No  Homicidal Thoughts:  No  Memory:  Immediate;   Good recent good  Judgement:  Intact  Insight:  Present  Psychomotor Activity:  Decreased  Concentration:  Fair  Recall:  Fair  Fund of Knowledge:Good  Language: Good  Akathisia:  No  Handed:  Right  AIMS (if indicated):     Assets:  Communication Skills Desire for Improvement Financial Resources/Insurance Housing Resilience Social Support Talents/Skills  Sleep:       Musculoskeletal: Strength & Muscle Tone: within normal limits Gait & Station: normal Patient leans: normal Medical Decision Making (Choose Three): Established Problem, Stable/Improving (1) and Review of Psycho-Social Stressors (1) Assessment: Axis I: MDD recurrent with psychotic features stable             Subungual hematoma to right index finger   Plan: 1. Continue current plan of medication as written. 2. Follow up in two months.  See PCP upstairs for treatment of hematoma.  Rona Ravens. Lanis Storlie RPAC 4:50 PM 10/21/2014

## 2014-10-21 NOTE — Patient Instructions (Signed)
1. Take all of your medications as discussed with your provider. (Please check your AVS, for the list.) 2. Call this office for any questions or problems. 3. Be sure to get plenty of rest and try for 7-9 hours of quality sleep each night. 4. Try to get regular exercise, at least 15-30 minutes each day.  A good walk will help tremendously! 5. Remember to do your mindfulness each day, breath deeply in and out, while having quiet reflection, prayer, meditation, or positive visualization. Unplug and turn off all electronic devices each day for your own personal time without interruption. This works! There are studies to back this up! 6. Be sure to take your B complex and Vitamin D3 each day. This will improve your overall wellbeing and boost your immune system as well. 7. Try to eat a nutritious healthy diet and avoid excessive alcohol and ALL tobacco products. 8. Be sure to keep all of your appointments with your outpatient therapist. If you do not have one, our office will be happy to assist you with this. 9. Be sure to keep your next follow up appointment in 6 weeks. 

## 2014-12-02 ENCOUNTER — Ambulatory Visit: Payer: Medicare HMO | Admitting: Family Medicine

## 2014-12-02 ENCOUNTER — Encounter: Payer: Self-pay | Admitting: Family Medicine

## 2014-12-02 ENCOUNTER — Ambulatory Visit (INDEPENDENT_AMBULATORY_CARE_PROVIDER_SITE_OTHER): Payer: Self-pay | Admitting: Family Medicine

## 2014-12-02 ENCOUNTER — Ambulatory Visit (HOSPITAL_COMMUNITY): Payer: Self-pay | Admitting: Physician Assistant

## 2014-12-02 VITALS — BP 137/74 | HR 54 | Wt 148.0 lb

## 2014-12-02 DIAGNOSIS — J42 Unspecified chronic bronchitis: Secondary | ICD-10-CM

## 2014-12-02 DIAGNOSIS — I10 Essential (primary) hypertension: Secondary | ICD-10-CM

## 2014-12-02 DIAGNOSIS — Z720 Tobacco use: Secondary | ICD-10-CM

## 2014-12-02 MED ORDER — ACLIDINIUM BROMIDE 400 MCG/ACT IN AEPB: INHALATION_SPRAY | RESPIRATORY_TRACT | Status: DC

## 2014-12-02 MED ORDER — ALBUTEROL SULFATE HFA 108 (90 BASE) MCG/ACT IN AERS: 2.0000 | INHALATION_SPRAY | Freq: Four times a day (QID) | RESPIRATORY_TRACT | Status: DC | PRN

## 2014-12-02 NOTE — Progress Notes (Signed)
   Subjective:    Patient ID: Sheila Beck, female    DOB: 1950-09-14, 64 y.o.   MRN: [id number]  HPI F/U COPD - She is Sheila Beck. Occ uses her nebulizer BID if getting sick.  Tobacco use - smoking about a pack a day.  Hasn't had any major problems this summer. She said she likes the heat as she is from [place]. It has not affected her breathing.  Hypertension- Pt denies chest pain, SOB, dizziness, or heart palpitations.  Taking meds as directed w/o problems.  Denies medication side effects.    Feels really irritable and will speak with her behavioral health tomorrow. Has appt tomorrow. She's just feeling very irritable around her 20 year old grandson and wants to move out and live by herself. She says her children are getting on her nerves as well.   Review of Systems     Objective:   Physical Exam  Constitutional: She is oriented to person, place, and time. She appears well-developed and well-nourished.  HENT:  Head: Normocephalic and atraumatic.  Cardiovascular: Normal rate, regular rhythm and normal heart sounds.   Pulmonary/Chest: Effort normal and breath sounds normal.  Neurological: She is alert and oriented to person, place, and time.  Skin: Skin is warm and dry.  Psychiatric: She has a normal mood and affect. Her behavior is normal.          Assessment & Plan:  HTN - well controlled. Into current regimen for follow up in 6 months. Blood work is up-to-date.  COPD - Stable. Continue current regimen. She is due for repeat spirometry so we'll schedule in 6 months. Call if any problems or acute illnesses.  Declined mammogram.  Will address again at 1 year.

## 2014-12-03 ENCOUNTER — Ambulatory Visit (HOSPITAL_COMMUNITY): Payer: Self-pay | Admitting: Physician Assistant

## 2014-12-09 ENCOUNTER — Encounter (HOSPITAL_COMMUNITY): Payer: Self-pay | Admitting: Physician Assistant

## 2014-12-09 ENCOUNTER — Ambulatory Visit (INDEPENDENT_AMBULATORY_CARE_PROVIDER_SITE_OTHER): Payer: Medicare HMO | Admitting: Physician Assistant

## 2014-12-09 VITALS — BP 130/80 | HR 65 | Ht 63.0 in | Wt 148.0 lb

## 2014-12-09 DIAGNOSIS — F1414 Cocaine abuse with cocaine-induced mood disorder: Secondary | ICD-10-CM

## 2014-12-09 DIAGNOSIS — F331 Major depressive disorder, recurrent, moderate: Secondary | ICD-10-CM

## 2014-12-09 DIAGNOSIS — F3160 Bipolar disorder, current episode mixed, unspecified: Secondary | ICD-10-CM

## 2014-12-09 DIAGNOSIS — F1210 Cannabis abuse, uncomplicated: Secondary | ICD-10-CM

## 2014-12-09 DIAGNOSIS — F1021 Alcohol dependence, in remission: Secondary | ICD-10-CM

## 2014-12-09 DIAGNOSIS — F1020 Alcohol dependence, uncomplicated: Secondary | ICD-10-CM

## 2014-12-09 MED ORDER — SERTRALINE HCL 100 MG PO TABS: ORAL_TABLET | ORAL | Status: DC

## 2014-12-09 MED ORDER — NALTREXONE HCL 50 MG PO TABS: 50.0000 mg | ORAL_TABLET | Freq: Every day | ORAL | Status: DC

## 2014-12-09 MED ORDER — BUPROPION HCL ER (XL) 300 MG PO TB24: 300.0000 mg | ORAL_TABLET | Freq: Every day | ORAL | Status: DC

## 2014-12-09 MED ORDER — RISPERIDONE 1 MG PO TABS: ORAL_TABLET | ORAL | Status: DC

## 2014-12-09 MED ORDER — HYDROXYZINE PAMOATE 50 MG PO CAPS: 50.0000 mg | ORAL_CAPSULE | Freq: Three times a day (TID) | ORAL | Status: DC | PRN

## 2014-12-09 MED ORDER — LAMOTRIGINE 200 MG PO TABS: 200.0000 mg | ORAL_TABLET | Freq: Every day | ORAL | Status: DC
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
## 2014-12-09 NOTE — Patient Instructions (Addendum)
1. Continue all medication as ordered. 2. Call this office if you have any questions or concerns. 3. Continue to get regular exercise 3-5 times a week. 4. Continue to eat a healthy nutritionally balanced diet. 5. Continue to reduce stress and anxiety through activities such as yoga, mindfulness, meditation and or prayer. 6. Keep all appointments with your out patient therapist and have notes forwarded to this office. (If you do not have one and would like to be scheduled with a therapist, please let our office assist you with this. 7. Follow up as planned.  Evidence based medicine supports that taking these two supplements will improve your overall mental health.  *Vitamin D3 (5000 i.u.) take one per day. *B complex take one per day.   1. Risperdal 0.5mg  AM and 0.5mg  At lunch. 2 mg at hs. 2. Visteril 25mg  po every 8 hours as needed for anxiety. 3. Continue all other medications as written. 4. Phone office Friday morning for update please. 5. Bring family member to the next office visit. 6. Follow up in 2 weeks.

## 2014-12-16 ENCOUNTER — Other Ambulatory Visit: Payer: Self-pay | Admitting: Family Medicine

## 2014-12-23 ENCOUNTER — Ambulatory Visit (HOSPITAL_COMMUNITY): Payer: Self-pay | Admitting: Physician Assistant

## 2014-12-25 ENCOUNTER — Encounter (HOSPITAL_COMMUNITY): Payer: Self-pay | Admitting: Physician Assistant

## 2014-12-25 ENCOUNTER — Ambulatory Visit (INDEPENDENT_AMBULATORY_CARE_PROVIDER_SITE_OTHER): Payer: Medicare HMO | Admitting: Physician Assistant

## 2014-12-25 VITALS — BP 128/76 | HR 67 | Ht 63.0 in | Wt 148.0 lb

## 2014-12-25 DIAGNOSIS — F319 Bipolar disorder, unspecified: Secondary | ICD-10-CM | POA: Diagnosis not present

## 2014-12-25 DIAGNOSIS — F1210 Cannabis abuse, uncomplicated: Secondary | ICD-10-CM

## 2014-12-25 DIAGNOSIS — F331 Major depressive disorder, recurrent, moderate: Secondary | ICD-10-CM
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
## 2014-12-29 ENCOUNTER — Encounter (HOSPITAL_COMMUNITY): Payer: Self-pay

## 2014-12-29 ENCOUNTER — Other Ambulatory Visit (HOSPITAL_COMMUNITY): Payer: Medicare HMO | Attending: Psychiatry | Admitting: Psychiatry

## 2014-12-29 DIAGNOSIS — F17210 Nicotine dependence, cigarettes, uncomplicated: Secondary | ICD-10-CM | POA: Insufficient documentation

## 2014-12-29 DIAGNOSIS — G4700 Insomnia, unspecified: Secondary | ICD-10-CM | POA: Diagnosis not present

## 2014-12-29 DIAGNOSIS — F332 Major depressive disorder, recurrent severe without psychotic features: Secondary | ICD-10-CM | POA: Insufficient documentation

## 2014-12-29 DIAGNOSIS — J449 Chronic obstructive pulmonary disease, unspecified: Secondary | ICD-10-CM | POA: Diagnosis not present

## 2014-12-29 DIAGNOSIS — F331 Major depressive disorder, recurrent, moderate: Secondary | ICD-10-CM

## 2014-12-29 DIAGNOSIS — F419 Anxiety disorder, unspecified: Secondary | ICD-10-CM | POA: Insufficient documentation
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
## 2014-12-29 NOTE — Progress Notes (Signed)
    Daily Group Progress Note  Program: IOP  Group Time: 9:00-10:30  Participation Level: Active  Behavioral Response: Appropriate  Type of Therapy:  Group Therapy  Summary of Progress: Pt. Met with case Freight forwarder.      Group Time: 10:30-12:00  Participation Level:  Minimal  Behavioral Response: Appropriate  Type of Therapy: Psycho-education Group  Summary of Progress: Pt. Did not speak in group and did not comment on discussion about four agreements by Rikki Spearing "Don't Make Assumptions".  Nancie Neas, LPC

## 2014-12-30 ENCOUNTER — Ambulatory Visit: Payer: Self-pay | Admitting: Family Medicine

## 2014-12-30 ENCOUNTER — Other Ambulatory Visit (HOSPITAL_COMMUNITY): Payer: Medicare HMO | Admitting: Psychiatry

## 2014-12-30 DIAGNOSIS — F331 Major depressive disorder, recurrent, moderate: Secondary | ICD-10-CM

## 2014-12-30 DIAGNOSIS — F332 Major depressive disorder, recurrent severe without psychotic features: Secondary | ICD-10-CM | POA: Diagnosis not present

## 2014-12-30 NOTE — Progress Notes (Signed)
    Daily Group Progress Note  Program: IOP  Group Time: 9:00-10:30  Participation Level: Minimal  Behavioral Response: Appropriate  Type of Therapy:  Group Therapy  Summary of Progress: Pt. Presents with primarily flat affect, depressed. Pt. Discussed disappointment regarding change in medication provider. Pt. Discussed grief associated with relocation from [place] and missing her mother and siblings, feeling that her daughter and immediate family do not care about her. Pt. Discussed long history of MDD and fears that she will not get better.      Group Time: 10:30-12:00  Participation Level:  Minimal  Behavioral Response: Appropriate  Type of Therapy: Psycho-education Group  Summary of Progress: Pt. Participated in guided meditation/breathing exercise. Pt. Reported that the exercise was difficult for her.   Shaune PollackBrown, Annamay Laymon B, LPC

## 2014-12-31 ENCOUNTER — Other Ambulatory Visit (HOSPITAL_COMMUNITY): Payer: Medicare HMO | Admitting: Psychiatry

## 2014-12-31 ENCOUNTER — Telehealth (HOSPITAL_COMMUNITY): Payer: Self-pay | Admitting: Psychiatry

## 2015-01-01 ENCOUNTER — Other Ambulatory Visit (HOSPITAL_COMMUNITY): Payer: Medicare HMO | Admitting: Psychiatry

## 2015-01-01 DIAGNOSIS — F332 Major depressive disorder, recurrent severe without psychotic features: Secondary | ICD-10-CM | POA: Diagnosis not present

## 2015-01-01 NOTE — Progress Notes (Signed)
    Daily Group Progress Note  Program: IOP  Group Time: 9:00-10:30  Participation Level: Minimal  Behavioral Response: Appropriate  Type of Therapy:  Group Therapy  Summary of Progress: Pt. Was awake and attentive but did not participate verbally in the group process, nodded occasionally when discussing themes related to developing healthy communication patterns in relationships.     Group Time: 10:30-12:00  Participation Level:  Minimal  Behavioral Response: Appropriate  Type of Therapy: Psycho-education Group  Summary of Progress: Pt. Was awake and attentive, but continued not to speak in psychoeducation group focused on normalizing and developing acceptance for full range of emotions.   BH-[facility] PSYCH

## 2015-01-02 ENCOUNTER — Other Ambulatory Visit (HOSPITAL_COMMUNITY): Payer: Medicare HMO | Admitting: Behavioral Health

## 2015-01-02 DIAGNOSIS — F331 Major depressive disorder, recurrent, moderate: Secondary | ICD-10-CM

## 2015-01-02 DIAGNOSIS — F332 Major depressive disorder, recurrent severe without psychotic features: Secondary | ICD-10-CM | POA: Diagnosis not present

## 2015-01-05 ENCOUNTER — Other Ambulatory Visit (HOSPITAL_COMMUNITY): Payer: Medicare HMO
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
## 2015-01-06 ENCOUNTER — Other Ambulatory Visit (HOSPITAL_COMMUNITY): Payer: Medicare HMO | Admitting: Psychiatry

## 2015-01-06 DIAGNOSIS — F332 Major depressive disorder, recurrent severe without psychotic features: Secondary | ICD-10-CM | POA: Diagnosis not present

## 2015-01-06 DIAGNOSIS — F331 Major depressive disorder, recurrent, moderate: Secondary | ICD-10-CM

## 2015-01-07 ENCOUNTER — Other Ambulatory Visit (HOSPITAL_COMMUNITY): Payer: Medicare HMO

## 2015-01-07 NOTE — Progress Notes (Signed)
    Daily Group Progress Note  Program: IOP  Group Time: 9:00-10:30  Participation Level: Minimal  Behavioral Response: Appropriate  Type of Therapy:  Group Therapy  Summary of Progress: Pt. Presents with slightly brightened affect. Pt. Discussed significant guilt related to alcohol and drug abuse and pain that she caused her children during her years of using. Pt. Discussed happy relationship with her mother and missing her due to relocation to [place].      Group Time: 10:30-12:00  Participation Level:  Active  Behavioral Response: Appropriate  Type of Therapy: Psycho-education Group  Summary of Progress: Pt. Participated in discussion about use of grounding exercise including bilateral tapping, 5-4-3-2-1, and grounding yoga sequence.   BH-PIOP[facility] PSYCH

## 2015-01-08 ENCOUNTER — Other Ambulatory Visit (HOSPITAL_COMMUNITY): Payer: Medicare HMO | Admitting: Psychiatry

## 2015-01-08 DIAGNOSIS — F332 Major depressive disorder, recurrent severe without psychotic features: Secondary | ICD-10-CM | POA: Diagnosis not present

## 2015-01-09 ENCOUNTER — Other Ambulatory Visit (HOSPITAL_COMMUNITY): Payer: Medicare HMO | Admitting: Psychiatry

## 2015-01-09 DIAGNOSIS — F331 Major depressive disorder, recurrent, moderate: Secondary | ICD-10-CM

## 2015-01-09 DIAGNOSIS — F332 Major depressive disorder, recurrent severe without psychotic features: Secondary | ICD-10-CM | POA: Diagnosis not present

## 2015-01-09 NOTE — Progress Notes (Signed)
    Daily Group Progress Note  Program: IOP  Group Time: 9:00-10:30  Participation Level: Minimal  Behavioral Response: Rigid  Type of Therapy:  Group Therapy  Summary of Progress: Pt. Continues to present as depressed with primarily flat affect, does not talk in group, not receptive or open to feedback from group. Pt. Was asked "What would you do differently if you believed you were enough?" Pt. Responded "nothing".      Group Time: 10:30-12:00  Participation Level:  Minimal  Behavioral Response: Appropriate  Type of Therapy: Psycho-education Group  Summary of Progress: Pt. Watched and discussed Shawn Achor video about applying positive psychology with behaviors including meditation, use of gratitude, journaling, physical exercise, and random acts of kindness.   Brown, Jennifer B, LPC 

## 2015-01-12 ENCOUNTER — Other Ambulatory Visit (HOSPITAL_COMMUNITY): Payer: Medicare HMO
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
## 2015-01-13 ENCOUNTER — Other Ambulatory Visit (HOSPITAL_COMMUNITY): Payer: Medicare HMO | Admitting: Psychiatry

## 2015-01-13 DIAGNOSIS — F332 Major depressive disorder, recurrent severe without psychotic features: Secondary | ICD-10-CM | POA: Diagnosis not present

## 2015-01-13 DIAGNOSIS — F331 Major depressive disorder, recurrent, moderate: Secondary | ICD-10-CM

## 2015-01-13 NOTE — Progress Notes (Signed)
    Daily Group Progress Note  Program: IOP  Group Time: 9:00-10:30  Participation Level: Active  Behavioral Response: Appropriate  Type of Therapy:  Group Therapy  Summary of Progress: Pt. Presents with brightened affect, talkative and makes good eye contact. Pt. Reports that she continues to forgiving herself her not being a good mother during period of alcohol and drug addiction. Pt. Watched and discussed Mel Robbins video about developing motivation.     Group Time: 10:30-12:00  Participation Level:  Active  Behavioral Response: Appropriate  Type of Therapy: Psycho-education Group  Summary of Progress: Pt. Participated in discussion about learning to live in the moment and engaged in "just for today" exercise.   Shaune Pollack, LPC

## 2015-01-14 ENCOUNTER — Other Ambulatory Visit (HOSPITAL_COMMUNITY): Payer: Medicare HMO

## 2015-01-15 ENCOUNTER — Other Ambulatory Visit (HOSPITAL_COMMUNITY): Payer: Medicare HMO | Admitting: Psychiatry

## 2015-01-15 DIAGNOSIS — F332 Major depressive disorder, recurrent severe without psychotic features: Secondary | ICD-10-CM | POA: Diagnosis not present

## 2015-01-15 DIAGNOSIS — F331 Major depressive disorder, recurrent, moderate: Secondary | ICD-10-CM
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
## 2015-01-16 ENCOUNTER — Other Ambulatory Visit (HOSPITAL_COMMUNITY): Payer: Medicare HMO | Admitting: Psychiatry

## 2015-01-16 DIAGNOSIS — F331 Major depressive disorder, recurrent, moderate: Secondary | ICD-10-CM

## 2015-01-16 DIAGNOSIS — F332 Major depressive disorder, recurrent severe without psychotic features: Secondary | ICD-10-CM | POA: Diagnosis not present
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
## 2015-01-19 ENCOUNTER — Other Ambulatory Visit (HOSPITAL_COMMUNITY): Payer: Medicare HMO

## 2015-01-20 ENCOUNTER — Other Ambulatory Visit (HOSPITAL_COMMUNITY): Payer: Medicare HMO | Admitting: Psychiatry

## 2015-01-20 NOTE — Progress Notes (Signed)
Patient ID: Sheila Beck, female   DOB: 1950/08/10, 64 y.o.   MRN: [id number] Discharge Note  Patient:  Sheila Beck is an 64 y.o., female DOB:  1951-01-11  Date of Admission:  12/29/2014  Date of Discharge:  01/20/2015  Reason for Admission:depression and anxiety  IOP Course:  Sheila Beck participated and attended.  Over all she seemed much less depressed and anxious.  She said she did feel less depressed but was just as anxious.  Still believed her medications need adjusting.  She had no suicidal thinking and on the day of discharge she was feeling much happier because she will be looking at an apartment tomorrow.  That is the one thing that will make her happiest, she believes.  She and her grandson have an understanding and that is helping as well.    Mental Status at Discharge:no suicidal thoughts, depressed mildly, anxious but thinks that will improve when she has her own place.  Lab Results: No results found for this or any previous visit (from the past 48 hour(s)).   Current outpatient prescriptions:  .  Aclidinium Bromide (TUDORZA PRESSAIR) 400 MCG/ACT AEPB, 1 inhalation twice a day., Disp: 1 each, Rfl: 5 .  albuterol (VENTOLIN HFA) 108 (90 BASE) MCG/ACT inhaler, Inhale 2 puffs into the lungs every 6 (six) hours as needed for wheezing or shortness of breath., Disp: 18 g, Rfl: 2 .  atorvastatin (LIPITOR) 40 MG tablet, Take 1 tablet (40 mg total) by mouth daily., Disp: 90 tablet, Rfl: 3 .  buPROPion (WELLBUTRIN XL) 300 MG 24 hr tablet, Take 1 tablet (300 mg total) by mouth daily., Disp: 30 tablet, Rfl: 2 .  gabapentin (NEURONTIN) 300 MG capsule, TAKE 1 CAPSULE THREE TIMES A DAY, Disp: 90 capsule, Rfl: 2 .  gemfibrozil (LOPID) 600 MG tablet, TAKE 1 TABLET (600 MG TOTAL) BY MOUTH 2 (TWO) TIMES DAILY., Disp: 60 tablet, Rfl: 11 .  ipratropium-albuterol (DUONEB) 0.5-2.5 (3) MG/3ML SOLN, Take 3 mLs by nebulization every 4 (four) hours., Disp: 360 mL, Rfl: 12 .  lamoTRIgine (LAMICTAL) 200 MG  tablet, Take 1 tablet (200 mg total) by mouth daily., Disp: 30 tablet, Rfl: 2 .  meloxicam (MOBIC) 15 MG tablet, TAKE 1 TABLET EVERY MORNING FOR 2 WEEKS THEN TAKE 1 TABLET EVERY DAY AS NEEDED, Disp: 30 tablet, Rfl: 3 .  naltrexone (DEPADE) 50 MG tablet, Take 1 tablet (50 mg total) by mouth daily., Disp: 30 tablet, Rfl: 1 .  omeprazole (PRILOSEC) 20 MG capsule, TAKE ONE CAPSULE EVERY DAY, Disp: 30 capsule, Rfl: 5 .  risperiDONE (RISPERDAL) 1 MG tablet, Take 1/2 tablet (0.5mg ) each morning and afternoon. Take two tablets at bedtime. ., Disp: 90 tablet, Rfl: 2 .  sertraline (ZOLOFT) 100 MG tablet, TAKE 2 TABLETS BY MOUTH DAILY., Disp: 60 tablet, Rfl: 2  Axis Diagnosis:  Major depression, recurrent moderate   Level of Care:  IOP  Discharge destination:  Other:  follow up with outpatient appointments with psychiatrist and therapist which have already been made  Is patient on multiple antipsychotic therapies at discharge:  No    Has Patient had three or more failed trials of antipsychotic monotherapy by history:  Negative  Patient phone:  [phone] (home)  Patient address:   [address]  [place] [address],   Follow-up recommendations:  Activity:  continue current activity Diet:  continue current diet  Comments:  none  The patient received suicide prevention pamphlet:  Yes Belongings returned:  na  Carolanne Grumbling 01/20/2015,  2:10 PM
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
## 2015-01-21 ENCOUNTER — Other Ambulatory Visit (HOSPITAL_COMMUNITY): Payer: Medicare HMO

## 2015-01-22 ENCOUNTER — Other Ambulatory Visit (HOSPITAL_COMMUNITY): Payer: Medicare HMO

## 2015-01-23 ENCOUNTER — Other Ambulatory Visit (HOSPITAL_COMMUNITY): Payer: Medicare HMO

## 2015-01-26 ENCOUNTER — Other Ambulatory Visit (HOSPITAL_COMMUNITY): Payer: Medicare HMO

## 2015-01-27 ENCOUNTER — Other Ambulatory Visit (HOSPITAL_COMMUNITY): Payer: Medicare HMO

## 2015-01-28 ENCOUNTER — Other Ambulatory Visit (HOSPITAL_COMMUNITY): Payer: Medicare HMO

## 2015-01-29 ENCOUNTER — Other Ambulatory Visit (HOSPITAL_COMMUNITY): Payer: Medicare HMO

## 2015-01-30 ENCOUNTER — Other Ambulatory Visit (HOSPITAL_COMMUNITY): Payer: Medicare HMO

## 2015-02-02 ENCOUNTER — Other Ambulatory Visit (HOSPITAL_COMMUNITY): Payer: Medicare HMO

## 2015-02-03 ENCOUNTER — Other Ambulatory Visit (HOSPITAL_COMMUNITY): Payer: Medicare HMO

## 2015-02-04 ENCOUNTER — Other Ambulatory Visit (HOSPITAL_COMMUNITY): Payer: Medicare HMO

## 2015-02-05 ENCOUNTER — Other Ambulatory Visit (HOSPITAL_COMMUNITY): Payer: Medicare HMO

## 2015-02-06 ENCOUNTER — Other Ambulatory Visit (HOSPITAL_COMMUNITY): Payer: Medicare HMO

## 2015-02-09 ENCOUNTER — Other Ambulatory Visit (HOSPITAL_COMMUNITY): Payer: Medicare HMO

## 2015-02-10 ENCOUNTER — Other Ambulatory Visit (HOSPITAL_COMMUNITY): Payer: Medicare HMO

## 2015-02-11 ENCOUNTER — Other Ambulatory Visit (HOSPITAL_COMMUNITY): Payer: Medicare HMO

## 2015-02-11 ENCOUNTER — Encounter (HOSPITAL_COMMUNITY): Payer: Self-pay | Admitting: Psychiatry

## 2015-02-11 ENCOUNTER — Ambulatory Visit (INDEPENDENT_AMBULATORY_CARE_PROVIDER_SITE_OTHER): Payer: Commercial Managed Care - HMO | Admitting: Psychiatry

## 2015-02-11 VITALS — BP 130/78 | HR 82 | Ht 63.0 in | Wt 133.0 lb

## 2015-02-11 DIAGNOSIS — F331 Major depressive disorder, recurrent, moderate: Secondary | ICD-10-CM

## 2015-02-11 DIAGNOSIS — F3130 Bipolar disorder, current episode depressed, mild or moderate severity, unspecified: Secondary | ICD-10-CM

## 2015-02-11 DIAGNOSIS — F1010 Alcohol abuse, uncomplicated: Secondary | ICD-10-CM | POA: Diagnosis not present

## 2015-02-11 DIAGNOSIS — F1210 Cannabis abuse, uncomplicated: Secondary | ICD-10-CM

## 2015-02-11 DIAGNOSIS — F3160 Bipolar disorder, current episode mixed, unspecified: Secondary | ICD-10-CM

## 2015-02-11 DIAGNOSIS — F1414 Cocaine abuse with cocaine-induced mood disorder: Secondary | ICD-10-CM

## 2015-02-11 DIAGNOSIS — F1021 Alcohol dependence, in remission: Secondary | ICD-10-CM

## 2015-02-11 DIAGNOSIS — F1020 Alcohol dependence, uncomplicated: Secondary | ICD-10-CM

## 2015-02-11 DIAGNOSIS — F332 Major depressive disorder, recurrent severe without psychotic features: Secondary | ICD-10-CM

## 2015-02-11 MED ORDER — SERTRALINE HCL 100 MG PO TABS: ORAL_TABLET | ORAL | Status: DC

## 2015-02-11 MED ORDER — NALTREXONE HCL 50 MG PO TABS: 50.0000 mg | ORAL_TABLET | Freq: Every day | ORAL | Status: DC

## 2015-02-11 MED ORDER — BUPROPION HCL ER (XL) 300 MG PO TB24: 300.0000 mg | ORAL_TABLET | Freq: Every day | ORAL | Status: DC

## 2015-02-11 MED ORDER — GABAPENTIN 300 MG PO CAPS: ORAL_CAPSULE | ORAL | Status: DC

## 2015-02-11 MED ORDER — LAMOTRIGINE 200 MG PO TABS: 300.0000 mg | ORAL_TABLET | Freq: Every day | ORAL | Status: DC
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
## 2015-02-11 NOTE — Patient Instructions (Signed)
Stop marijuana. Medications would not work unless it is stopped.

## 2015-02-12 ENCOUNTER — Other Ambulatory Visit (HOSPITAL_COMMUNITY): Payer: Medicare HMO

## 2015-02-13 ENCOUNTER — Other Ambulatory Visit (HOSPITAL_COMMUNITY): Payer: Medicare HMO

## 2015-02-16 ENCOUNTER — Other Ambulatory Visit (HOSPITAL_COMMUNITY): Payer: Medicare HMO

## 2015-02-17 ENCOUNTER — Other Ambulatory Visit (HOSPITAL_COMMUNITY): Payer: Medicare HMO

## 2015-02-18 ENCOUNTER — Other Ambulatory Visit (HOSPITAL_COMMUNITY): Payer: Medicare HMO

## 2015-02-19 ENCOUNTER — Other Ambulatory Visit (HOSPITAL_COMMUNITY): Payer: Medicare HMO

## 2015-02-20 ENCOUNTER — Other Ambulatory Visit (HOSPITAL_COMMUNITY): Payer: Medicare HMO

## 2015-03-10 ENCOUNTER — Other Ambulatory Visit: Payer: Medicare HMO | Admitting: Family Medicine

## 2015-03-11 ENCOUNTER — Telehealth (HOSPITAL_COMMUNITY): Payer: Self-pay | Admitting: *Deleted

## 2015-03-11 ENCOUNTER — Ambulatory Visit (HOSPITAL_COMMUNITY): Payer: Self-pay | Admitting: Psychiatry

## 2015-03-11 ENCOUNTER — Encounter: Payer: Self-pay | Admitting: Family Medicine

## 2015-03-11 ENCOUNTER — Ambulatory Visit (INDEPENDENT_AMBULATORY_CARE_PROVIDER_SITE_OTHER): Payer: Medicare HMO | Admitting: Licensed Clinical Social Worker

## 2015-03-11 ENCOUNTER — Ambulatory Visit (INDEPENDENT_AMBULATORY_CARE_PROVIDER_SITE_OTHER): Payer: Commercial Managed Care - HMO | Admitting: Family Medicine

## 2015-03-11 VITALS — BP 145/70 | HR 92 | Wt 132.0 lb

## 2015-03-11 DIAGNOSIS — F3130 Bipolar disorder, current episode depressed, mild or moderate severity, unspecified: Secondary | ICD-10-CM

## 2015-03-11 DIAGNOSIS — F4310 Post-traumatic stress disorder, unspecified: Secondary | ICD-10-CM

## 2015-03-11 DIAGNOSIS — R0602 Shortness of breath: Secondary | ICD-10-CM | POA: Diagnosis not present

## 2015-03-11 DIAGNOSIS — F1210 Cannabis abuse, uncomplicated: Secondary | ICD-10-CM

## 2015-03-11 DIAGNOSIS — F410 Panic disorder [episodic paroxysmal anxiety] without agoraphobia: Secondary | ICD-10-CM

## 2015-03-11 DIAGNOSIS — F1021 Alcohol dependence, in remission: Secondary | ICD-10-CM

## 2015-03-11 DIAGNOSIS — J42 Unspecified chronic bronchitis: Secondary | ICD-10-CM | POA: Diagnosis not present

## 2015-03-11 MED ORDER — ALBUTEROL SULFATE (2.5 MG/3ML) 0.083% IN NEBU
2.5000 mg | INHALATION_SOLUTION | Freq: Once | RESPIRATORY_TRACT | Status: AC
  Administered 2015-03-11: 2.5 mg via RESPIRATORY_TRACT

## 2015-03-11 MED ORDER — TIOTROPIUM BROMIDE-OLODATEROL 2.5-2.5 MCG/ACT IN AERS: 2.0000 | INHALATION_SPRAY | Freq: Every day | RESPIRATORY_TRACT | Status: DC

## 2015-03-11 NOTE — Telephone Encounter (Signed)
Pt states her medication is not working. She can't sleep and she is only eating once per day. Pt states she is feeling more depressed and she needs help with the medication. Advise pt she will need to schedule an appt for medication management. Pt states she does not want to be commit to in-patient. Please call to advise.
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
## 2015-03-11 NOTE — Progress Notes (Signed)
   Subjective:    Patient ID: Sheila Beck, female    DOB: October 09, 1950, 64 y.o.   MRN: [id number]  HPI  here today for spirometry to follow-up for COPD. She has never had spirometry performed.  She said she uses her albuterol first thing in the morning and then occasionally needs a second dose in the afternoon. Later dose really is done as needed. Her biggest concern is her shortness of breath with her panic attacks. When that happens she feels like she really can't brief.  she was previously on Symbicort when she first became a patient here.  Review of Systems     Objective:   Physical Exam  Constitutional: She is oriented to person, place, and time. She appears well-developed and well-nourished.  HENT:  Head: Normocephalic and atraumatic.  Cardiovascular: Normal rate, regular rhythm and normal heart sounds.   Pulmonary/Chest: Effort normal. She has wheezes.  Slight expiratory wheeze  Neurological: She is alert and oriented to person, place, and time.  Skin: Skin is warm and dry.  Psychiatric: She has a normal mood and affect. Her behavior is normal.          Assessment & Plan:  COPD -  spirometry-FVC of 78%, FEV1 of 82% with a ratio of 83 percent. No significant improvement post bronchodilator. With a ratio technically being greater than 70 it would mean she has a normal spirometry. I would like to get a chest x-ray 2 further workup the diagnosis. In the meantime I'm going to switch her to [facility] which is a long-acting albuterol/she's not using her albuterol daily and sometimes twice a day. Currently she is using for symptomatically relief and now I am wondering if it really is because of COPD or other factors. The she does have a slight wheeze on exam today.

## 2015-03-12 DIAGNOSIS — F410 Panic disorder [episodic paroxysmal anxiety] without agoraphobia: Secondary | ICD-10-CM | POA: Insufficient documentation

## 2015-03-12 DIAGNOSIS — F3130 Bipolar disorder, current episode depressed, mild or moderate severity, unspecified: Secondary | ICD-10-CM | POA: Insufficient documentation

## 2015-03-12 NOTE — Telephone Encounter (Signed)
Please schedule early appointment.  Call 911 or report to local ED for any urgent concern or suicidal toughts.

## 2015-03-15 ENCOUNTER — Other Ambulatory Visit: Payer: Self-pay | Admitting: Family Medicine

## 2015-03-18 ENCOUNTER — Ambulatory Visit (INDEPENDENT_AMBULATORY_CARE_PROVIDER_SITE_OTHER): Payer: Medicare HMO | Admitting: Psychiatry

## 2015-03-18 ENCOUNTER — Encounter (HOSPITAL_COMMUNITY): Payer: Self-pay | Admitting: Psychiatry

## 2015-03-18 DIAGNOSIS — F1210 Cannabis abuse, uncomplicated: Secondary | ICD-10-CM

## 2015-03-18 DIAGNOSIS — F1020 Alcohol dependence, uncomplicated: Secondary | ICD-10-CM

## 2015-03-18 DIAGNOSIS — F331 Major depressive disorder, recurrent, moderate: Secondary | ICD-10-CM | POA: Diagnosis not present

## 2015-03-18 DIAGNOSIS — F1414 Cocaine abuse with cocaine-induced mood disorder: Secondary | ICD-10-CM

## 2015-03-18 DIAGNOSIS — F1021 Alcohol dependence, in remission: Secondary | ICD-10-CM

## 2015-03-18 DIAGNOSIS — F3160 Bipolar disorder, current episode mixed, unspecified: Secondary | ICD-10-CM

## 2015-03-18 MED ORDER — LAMOTRIGINE 200 MG PO TABS: 200.0000 mg | ORAL_TABLET | Freq: Two times a day (BID) | ORAL | Status: DC

## 2015-03-18 MED ORDER — OXCARBAZEPINE 150 MG PO TABS: 150.0000 mg | ORAL_TABLET | Freq: Two times a day (BID) | ORAL | Status: DC
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
## 2015-03-25 ENCOUNTER — Ambulatory Visit (INDEPENDENT_AMBULATORY_CARE_PROVIDER_SITE_OTHER): Payer: Medicare HMO | Admitting: Licensed Clinical Social Worker

## 2015-03-25 DIAGNOSIS — F3130 Bipolar disorder, current episode depressed, mild or moderate severity, unspecified: Secondary | ICD-10-CM

## 2015-03-25 DIAGNOSIS — F4310 Post-traumatic stress disorder, unspecified: Secondary | ICD-10-CM

## 2015-03-25 DIAGNOSIS — F1210 Cannabis abuse, uncomplicated: Secondary | ICD-10-CM

## 2015-03-25 DIAGNOSIS — F410 Panic disorder [episodic paroxysmal anxiety] without agoraphobia: Secondary | ICD-10-CM
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
## 2015-04-04 ENCOUNTER — Other Ambulatory Visit: Payer: Self-pay | Admitting: Family Medicine

## 2015-04-04 ENCOUNTER — Other Ambulatory Visit (HOSPITAL_COMMUNITY): Payer: Self-pay | Admitting: Psychiatry

## 2015-04-08 ENCOUNTER — Ambulatory Visit (INDEPENDENT_AMBULATORY_CARE_PROVIDER_SITE_OTHER): Payer: Medicare HMO | Admitting: Licensed Clinical Social Worker

## 2015-04-08 DIAGNOSIS — F4310 Post-traumatic stress disorder, unspecified: Secondary | ICD-10-CM

## 2015-04-08 DIAGNOSIS — F319 Bipolar disorder, unspecified: Secondary | ICD-10-CM

## 2015-04-08 NOTE — Telephone Encounter (Signed)
Received medication request for Depade 50mg . Per Dr. Gilmore LarocheAkhtar, medication request is denied. Pt has request medication refill for Depade 50mg  too soon. Pt is schedule for a f/u appt on 04/15/15.

## 2015-04-08 NOTE — Progress Notes (Signed)
   THERAPIST PROGRESS NOTE  Session Time: 1:15pm-1:45pm  Participation Level: Active  Behavioral Response: CasualAlertEuthymic  Type of Therapy: Individual Therapy  Treatment Goals addressed: enjoyment of activities  Interventions: assessment of treatment progress  Suicidal/Homicidal:  denied both  Therapist Interventions:   Had patient complete a PHQ-9 and GAD-7 to assess for severity of depression and anxiety symptoms. Discussed how her new living arrangement has greatly contributed to the improvements with her mood.  Summary:  PHQ-9 score indicated she is no longer experiencing symptoms of depression.   One the GAD-7 the only symptom she endorsed was feeling nervous several days per week.   Attributes dramatic improvement in her mood to change in her living environment and changes with her medication.  Reported feeling safe despite living alone.  Enjoying solitary activities such as reading, watching TV, playing on her tablet, and cleaning.  Has made some friends in her community.             Plan: No further sessions scheduled at this time as patient reports feeling satisfied with the progress she has made with increasing enjoyment of activities.    Diagnosis: Bipolar I Disorder, unspecified                         PTSD                         Panic Disorder                         Cannabis Abuse    Sheila Beck, Sarah A, LCSW 04/08/15

## 2015-04-10 ENCOUNTER — Other Ambulatory Visit (HOSPITAL_COMMUNITY): Payer: Self-pay | Admitting: Psychiatry

## 2015-04-14 NOTE — Telephone Encounter (Signed)
Received medication request from [facility] for Naltrexone 50mg . Per Dr. Gilmore LarocheAkhtar, pt is authorized for a refill for Naltrexone 50mg , #30. Prescription was sent to pharmacy. Pt has a f/u appt on 04/15/15. Called and informed pt of prescription status. Pt verbalizes understanding.

## 2015-04-15 ENCOUNTER — Ambulatory Visit (INDEPENDENT_AMBULATORY_CARE_PROVIDER_SITE_OTHER): Payer: Medicare HMO | Admitting: Psychiatry

## 2015-04-15 ENCOUNTER — Encounter (HOSPITAL_COMMUNITY): Payer: Self-pay | Admitting: Psychiatry

## 2015-04-15 VITALS — BP 126/72 | HR 90 | Ht 63.0 in | Wt 131.0 lb

## 2015-04-15 DIAGNOSIS — F1210 Cannabis abuse, uncomplicated: Secondary | ICD-10-CM

## 2015-04-15 DIAGNOSIS — F1290 Cannabis use, unspecified, uncomplicated: Secondary | ICD-10-CM

## 2015-04-15 DIAGNOSIS — F4310 Post-traumatic stress disorder, unspecified: Secondary | ICD-10-CM

## 2015-04-15 DIAGNOSIS — F1021 Alcohol dependence, in remission: Secondary | ICD-10-CM

## 2015-04-15 DIAGNOSIS — F1414 Cocaine abuse with cocaine-induced mood disorder: Secondary | ICD-10-CM

## 2015-04-15 DIAGNOSIS — F319 Bipolar disorder, unspecified: Secondary | ICD-10-CM

## 2015-04-15 DIAGNOSIS — F3160 Bipolar disorder, current episode mixed, unspecified: Secondary | ICD-10-CM

## 2015-04-15 DIAGNOSIS — F331 Major depressive disorder, recurrent, moderate: Secondary | ICD-10-CM

## 2015-04-15 DIAGNOSIS — F1020 Alcohol dependence, uncomplicated: Secondary | ICD-10-CM

## 2015-04-15 MED ORDER — OXCARBAZEPINE 150 MG PO TABS: 150.0000 mg | ORAL_TABLET | Freq: Two times a day (BID) | ORAL | Status: DC

## 2015-04-15 MED ORDER — NALTREXONE HCL 50 MG PO TABS: ORAL_TABLET | ORAL | Status: DC

## 2015-04-15 MED ORDER — LAMOTRIGINE 200 MG PO TABS: 200.0000 mg | ORAL_TABLET | Freq: Two times a day (BID) | ORAL | Status: DC

## 2015-04-15 MED ORDER — BUPROPION HCL ER (XL) 300 MG PO TB24: 300.0000 mg | ORAL_TABLET | Freq: Every day | ORAL | Status: DC
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
## 2015-04-22 ENCOUNTER — Ambulatory Visit (HOSPITAL_COMMUNITY): Payer: Self-pay | Admitting: Licensed Clinical Social Worker

## 2015-04-28 ENCOUNTER — Other Ambulatory Visit (HOSPITAL_COMMUNITY): Payer: Self-pay | Admitting: Psychiatry

## 2015-05-01 ENCOUNTER — Other Ambulatory Visit (HOSPITAL_COMMUNITY): Payer: Self-pay | Admitting: Psychiatry

## 2015-05-01 NOTE — Telephone Encounter (Signed)
Received medication request from [facility] for Lamictal 200mg . Per Dr. Gilmore LarocheAkhtar, medication request is denied. Rx was last filled on 04/15/15. Called and informed pt of prescription status. Pt verbalizes understanding.

## 2015-05-01 NOTE — Telephone Encounter (Signed)
Received medication request from [facility] for Zoloft 100mg . Per Dr. Gilmore LarocheAkhtar, medication request is denied. Rx was last filled on 05/01/15. Called and informed pt of prescription status. Pt verbalizes understanding.

## 2015-05-04 ENCOUNTER — Other Ambulatory Visit (HOSPITAL_COMMUNITY): Payer: Self-pay | Admitting: Psychiatry

## 2015-05-05 NOTE — Telephone Encounter (Signed)
Received medication request from [facility] for Wellbutrin 300mg . Per Dr. Gilmore LarocheAkhtar, medication is denied. Pt has request medication too early. Rx was sent to pharmacy on 04/15/15 for Wellbutrin 300mg , #30 w/ 1 additional refill. Pt is schedule for a f/u appt on 06/03/15.

## 2015-05-06 ENCOUNTER — Ambulatory Visit (HOSPITAL_COMMUNITY): Payer: Self-pay | Admitting: Licensed Clinical Social Worker

## 2015-05-20 ENCOUNTER — Ambulatory Visit (HOSPITAL_COMMUNITY): Payer: Self-pay | Admitting: Licensed Clinical Social Worker

## 2015-05-27 ENCOUNTER — Ambulatory Visit (HOSPITAL_COMMUNITY): Payer: Self-pay | Admitting: Licensed Clinical Social Worker

## 2015-06-02 ENCOUNTER — Other Ambulatory Visit (HOSPITAL_COMMUNITY): Payer: Self-pay | Admitting: Psychiatry

## 2015-06-02 ENCOUNTER — Other Ambulatory Visit: Payer: Self-pay | Admitting: Family Medicine

## 2015-06-03 ENCOUNTER — Ambulatory Visit (INDEPENDENT_AMBULATORY_CARE_PROVIDER_SITE_OTHER): Payer: Self-pay | Admitting: Family Medicine

## 2015-06-03 ENCOUNTER — Ambulatory Visit (INDEPENDENT_AMBULATORY_CARE_PROVIDER_SITE_OTHER): Payer: Medicare HMO | Admitting: Psychiatry

## 2015-06-03 ENCOUNTER — Encounter (HOSPITAL_COMMUNITY): Payer: Self-pay | Admitting: Psychiatry

## 2015-06-03 ENCOUNTER — Encounter: Payer: Self-pay | Admitting: Family Medicine

## 2015-06-03 VITALS — BP 126/65 | HR 75 | Wt 128.0 lb

## 2015-06-03 VITALS — BP 136/82 | HR 77 | Ht 66.0 in | Wt 129.0 lb

## 2015-06-03 DIAGNOSIS — F3130 Bipolar disorder, current episode depressed, mild or moderate severity, unspecified: Secondary | ICD-10-CM

## 2015-06-03 DIAGNOSIS — F331 Major depressive disorder, recurrent, moderate: Secondary | ICD-10-CM

## 2015-06-03 DIAGNOSIS — F1210 Cannabis abuse, uncomplicated: Secondary | ICD-10-CM

## 2015-06-03 DIAGNOSIS — L301 Dyshidrosis [pompholyx]: Secondary | ICD-10-CM

## 2015-06-03 DIAGNOSIS — R21 Rash and other nonspecific skin eruption: Secondary | ICD-10-CM

## 2015-06-03 DIAGNOSIS — I10 Essential (primary) hypertension: Secondary | ICD-10-CM

## 2015-06-03 DIAGNOSIS — Z114 Encounter for screening for human immunodeficiency virus [HIV]: Secondary | ICD-10-CM

## 2015-06-03 DIAGNOSIS — F1299 Cannabis use, unspecified with unspecified cannabis-induced disorder: Secondary | ICD-10-CM | POA: Diagnosis not present

## 2015-06-03 DIAGNOSIS — F319 Bipolar disorder, unspecified: Secondary | ICD-10-CM

## 2015-06-03 DIAGNOSIS — F1414 Cocaine abuse with cocaine-induced mood disorder: Secondary | ICD-10-CM

## 2015-06-03 DIAGNOSIS — F1021 Alcohol dependence, in remission: Secondary | ICD-10-CM

## 2015-06-03 DIAGNOSIS — F3160 Bipolar disorder, current episode mixed, unspecified: Secondary | ICD-10-CM

## 2015-06-03 DIAGNOSIS — F4310 Post-traumatic stress disorder, unspecified: Secondary | ICD-10-CM

## 2015-06-03 DIAGNOSIS — Z1159 Encounter for screening for other viral diseases: Secondary | ICD-10-CM

## 2015-06-03 DIAGNOSIS — Z720 Tobacco use: Secondary | ICD-10-CM

## 2015-06-03 DIAGNOSIS — F1020 Alcohol dependence, uncomplicated: Secondary | ICD-10-CM

## 2015-06-03 DIAGNOSIS — J42 Unspecified chronic bronchitis: Secondary | ICD-10-CM

## 2015-06-03 LAB — COMPLETE METABOLIC PANEL WITH GFR
ALT: 14 U/L (ref 6–29)
AST: 18 U/L (ref 10–35)
Albumin: 4.3 g/dL (ref 3.6–5.1)
Alkaline Phosphatase: 70 U/L (ref 33–130)
BUN: 11 mg/dL (ref 7–25)
CHLORIDE: 96 mmol/L — AB (ref 98–110)
CO2: 23 mmol/L (ref 20–31)
CREATININE: 0.85 mg/dL (ref 0.50–0.99)
Calcium: 9.3 mg/dL (ref 8.6–10.4)
GFR, EST AFRICAN AMERICAN: 84 mL/min (ref >=60)
GFR, Est Non African American: 73 mL/min (ref >=60)
GLUCOSE: 92 mg/dL (ref 65–99)
Potassium: 4 mmol/L (ref 3.5–5.3)
Sodium: 127 mmol/L — ABNORMAL LOW (ref 135–146)
Total Bilirubin: 0.4 mg/dL (ref 0.2–1.2)
Total Protein: 6.7 g/dL (ref 6.1–8.1)

## 2015-06-03 MED ORDER — LAMOTRIGINE 200 MG PO TABS: 200.0000 mg | ORAL_TABLET | Freq: Two times a day (BID) | ORAL | Status: DC

## 2015-06-03 MED ORDER — OXCARBAZEPINE 150 MG PO TABS: 150.0000 mg | ORAL_TABLET | Freq: Two times a day (BID) | ORAL | Status: DC

## 2015-06-03 MED ORDER — SERTRALINE HCL 100 MG PO TABS: ORAL_TABLET | ORAL | Status: DC

## 2015-06-03 MED ORDER — CLOBETASOL PROPIONATE 0.05 % EX OINT: 1.0000 "application " | TOPICAL_OINTMENT | Freq: Two times a day (BID) | CUTANEOUS | Status: DC

## 2015-06-03 MED ORDER — BUPROPION HCL ER (XL) 300 MG PO TB24: 300.0000 mg | ORAL_TABLET | Freq: Every day | ORAL | Status: DC
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
## 2015-06-03 NOTE — Progress Notes (Signed)
   Subjective:    Patient ID: Sheila Beck, female    DOB: 02/03/1951, 64 y.o.   MRN: [id number]  HPI Here for follow-up COPD. We did spirometry 2 months ago in September. We switched her tested Stiolto.   She finally moved out and is living alone.  She's very happy about this and says has really significantly reduced her daily stress. She says she's even cut back on smoking some.   Has rash on both hands.  Worse on the right hand.  She had a similar rash last year in the wintertime but says this year it seems to be worse. She says it's very itchy and is causing some skin cracking especially over her knuckles. She's been using Vaseline and then putting socks over it.  Also tried cutting with Vaseline and then putting rubber gloves over top of it. She's done this for at least several weeks without any significant relief.  Hx alcoholism - she hasn't had a drink. Her family was worried since she is now living alone but says she is doing well.   Hypertension- Pt denies chest pain, SOB, dizziness, or heart palpitations.  Taking meds as directed w/o problems.  Denies medication side effects.     Review of Systems     Objective:   Physical Exam  Constitutional: She is oriented to person, place, and time. She appears well-developed and well-nourished.  HENT:  Head: Normocephalic and atraumatic.  Cardiovascular: Normal rate, regular rhythm and normal heart sounds.   Pulmonary/Chest: Effort normal and breath sounds normal.  Neurological: She is alert and oriented to person, place, and time.  Skin: Skin is warm and dry.  She has thick scale dry skin around the fingers on her right and left hand. It does seem to go into the web creases. On a small amount on the palm. The dorsum of the hands seem to be spared predominately.  Psychiatric: She has a normal mood and affect. Her behavior is normal.          Assessment & Plan:  COPD-she's doing much better on the Stiolto and feels like  symptomatically she is doing better. Her lung exam is clear today which is great. She hasn't had any recent flares. Follow-up in 3 months. Continue to work on reducing her smoking.  Tobacco abuse-she has cut back some and just encouraged her to continue to work at it.  Dyshidrotic eczema-I did do a skin scraping just rule out any fungal elements since it seems to be worse than usual. Will treat with topical steroid ointment and then coated with Vaseline on top. Try to avoid immersing hands in water repetitively.  Hypertension-well-controlled. Continue current regimen.  Due for screening for hepatitis C and for HIV.  Declined colonoscopy but she is willing to do stool cards. Set provided today.

## 2015-06-04 LAB — HEPATITIS C ANTIBODY: HCV AB: NEGATIVE

## 2015-06-04 LAB — HIV ANTIBODY (ROUTINE TESTING W REFLEX): HIV: NONREACTIVE

## 2015-06-08 ENCOUNTER — Other Ambulatory Visit: Payer: Self-pay | Admitting: *Deleted

## 2015-06-08 ENCOUNTER — Other Ambulatory Visit (HOSPITAL_COMMUNITY)
Admission: RE | Admit: 2015-06-08 | Discharge: 2015-06-08 | Disposition: A | Discharge status: Home / Self Care | Payer: Commercial Managed Care - HMO | Source: Ambulatory Visit | Attending: Family Medicine | Admitting: Family Medicine

## 2015-06-08 ENCOUNTER — Telehealth: Payer: Self-pay

## 2015-06-08 DIAGNOSIS — I10 Essential (primary) hypertension: Secondary | ICD-10-CM

## 2015-06-08 DIAGNOSIS — R21 Rash and other nonspecific skin eruption: Secondary | ICD-10-CM

## 2015-06-08 NOTE — Telephone Encounter (Signed)
Changed order from dermatology to cytology.

## 2015-06-12 ENCOUNTER — Other Ambulatory Visit: Payer: Self-pay | Admitting: *Deleted

## 2015-06-12 DIAGNOSIS — I10 Essential (primary) hypertension: Secondary | ICD-10-CM

## 2015-06-12 LAB — BASIC METABOLIC PANEL
BUN: 14 mg/dL (ref 7–25)
CALCIUM: 9.5 mg/dL (ref 8.6–10.4)
CHLORIDE: 101 mmol/L (ref 98–110)
CO2: 23 mmol/L (ref 20–31)
CREATININE: 0.89 mg/dL (ref 0.50–0.99)
Glucose, Bld: 69 mg/dL (ref 65–99)
Potassium: 4.3 mmol/L (ref 3.5–5.3)
SODIUM: 134 mmol/L — AB (ref 135–146)

## 2015-06-16 ENCOUNTER — Telehealth: Payer: Self-pay

## 2015-06-16 NOTE — Telephone Encounter (Signed)
-----   Message from Agapito Gamesatherine D Metheney, MD sent at 06/15/2015  7:28 PM EST ----- Call pt: sodium is much better.

## 2015-06-16 NOTE — Telephone Encounter (Signed)
Spoke with patient regarding sodium results.  Also spoke with patient regarding skin culture.  She is aware the culture was not processed but she is not concerned about having it redone, her skin is much better.  She will call if this changes.

## 2015-07-08 ENCOUNTER — Other Ambulatory Visit (HOSPITAL_COMMUNITY): Payer: Self-pay | Admitting: Psychiatry

## 2015-07-13 ENCOUNTER — Ambulatory Visit: Payer: Self-pay | Admitting: Family Medicine

## 2015-07-13 NOTE — Telephone Encounter (Signed)
Received medication request from [facility] for Zoloft, Trileptal, Gabapentin and Naltrexone. Per Dr. Gilmore Laroche, pt is authorized for refills for Gabapentin , #90 w/ 1 refill, Naltrexone , #30 w/1 refill. Zoloft , #60 and Trileptal , #60. Prescriptions were sent to pharmacy. Pt is schedule for a f/u appt on 3/15. Called and informed pt of prescription status. Pt verbalizes understanding.

## 2015-07-31 ENCOUNTER — Ambulatory Visit (INDEPENDENT_AMBULATORY_CARE_PROVIDER_SITE_OTHER): Payer: Commercial Managed Care - HMO | Admitting: Family Medicine

## 2015-07-31 ENCOUNTER — Encounter: Payer: Self-pay | Admitting: Family Medicine

## 2015-07-31 VITALS — BP 126/56 | HR 93 | Ht 66.0 in | Wt 125.3 lb

## 2015-07-31 DIAGNOSIS — I10 Essential (primary) hypertension: Secondary | ICD-10-CM | POA: Diagnosis not present

## 2015-07-31 DIAGNOSIS — H6121 Impacted cerumen, right ear: Secondary | ICD-10-CM

## 2015-07-31 DIAGNOSIS — Z0000 Encounter for general adult medical examination without abnormal findings: Secondary | ICD-10-CM

## 2015-07-31 DIAGNOSIS — Z13220 Encounter for screening for lipoid disorders: Secondary | ICD-10-CM | POA: Diagnosis not present

## 2015-07-31 DIAGNOSIS — Z720 Tobacco use: Secondary | ICD-10-CM | POA: Diagnosis not present

## 2015-07-31 DIAGNOSIS — E871 Hypo-osmolality and hyponatremia: Secondary | ICD-10-CM

## 2015-07-31 DIAGNOSIS — F1020 Alcohol dependence, uncomplicated: Secondary | ICD-10-CM

## 2015-07-31 LAB — COMPLETE METABOLIC PANEL WITH GFR
ALBUMIN: 4.3 g/dL (ref 3.6–5.1)
ALK PHOS: 58 U/L (ref 33–130)
ALT: 13 U/L (ref 6–29)
AST: 16 U/L (ref 10–35)
BILIRUBIN TOTAL: 0.5 mg/dL (ref 0.2–1.2)
BUN: 16 mg/dL (ref 7–25)
CALCIUM: 9.1 mg/dL (ref 8.6–10.4)
CO2: 22 mmol/L (ref 20–31)
CREATININE: 0.85 mg/dL (ref 0.50–0.99)
Chloride: 100 mmol/L (ref 98–110)
GFR, Est African American: 84 mL/min (ref >=60)
GFR, Est Non African American: 73 mL/min (ref >=60)
GLUCOSE: 96 mg/dL (ref 65–99)
POTASSIUM: 4.4 mmol/L (ref 3.5–5.3)
SODIUM: 131 mmol/L — AB (ref 135–146)
TOTAL PROTEIN: 6.7 g/dL (ref 6.1–8.1)

## 2015-07-31 LAB — LIPID PANEL
CHOL/HDL RATIO: 2.5 ratio (ref <=5.0)
CHOLESTEROL: 145 mg/dL (ref 125–200)
HDL: 57 mg/dL (ref >=46)
LDL Cholesterol: 73 mg/dL (ref <130)
Triglycerides: 76 mg/dL (ref <150)
VLDL: 15 mg/dL (ref <30)

## 2015-07-31 NOTE — Progress Notes (Signed)
Subjective:    Sheila Beck is a 65 y.o. female who presents for Medicare Annual/Subsequent preventive examination.  Preventive Screening-Counseling & Management  Tobacco History  Smoking status  . Current Every Day Smoker -- 1.50 packs/day for 51 years  . Types: Cigarettes  . Start date: 06/20/1961  Smokeless tobacco  . Not on file     Problems Prior to Visit 1. Wants me to look in her right era. Says it as been "blocked up " for the last 4 days. Says she tried to irrigate it but wasn't able to get anything out.    Current Problems (verified) Patient Active Problem List   Diagnosis Date Noted  . Bipolar I disorder, most recent episode depressed (HCC) 03/12/2015  . Panic disorder 03/12/2015  . Severe recurrent major depression without psychotic features (HCC) 12/29/2014    Class: Chronic  . Severe alcohol use disorder (HCC) 06/28/2013  . Mild cannabis use disorder 06/28/2013  . CKD (chronic kidney disease) stage 3, GFR 30-59 ml/min 04/07/2013  . Cannabis abuse 02/08/2013  . Trochanteric bursitis of right hip 01/22/2013  . Essential hypertension, benign 10/30/2012  . Tobacco abuse 10/30/2012  . COPD (chronic obstructive pulmonary disease) (HCC) 10/30/2012  . Bipolar I disorder, most recent episode (or current) depressed, severe, without mention of psychotic behavior 10/26/2012  . Cocaine abuse with cocaine-induced mood disorder (HCC) 10/26/2012    Medications Prior to Visit Current Outpatient Prescriptions on File Prior to Visit  Medication Sig Dispense Refill  . albuterol (VENTOLIN HFA) 108 (90 BASE) MCG/ACT inhaler Inhale 2 puffs into the lungs every 6 (six) hours as needed for wheezing or shortness of breath. 18 g 2  . atorvastatin (LIPITOR) 40 MG tablet Take 1 tablet (40 mg total) by mouth daily. 90 tablet 3  . buPROPion (WELLBUTRIN XL) 300 MG 24 hr tablet Take 1 tablet (300 mg total) by mouth daily. 30 tablet 1  . clobetasol ointment (TEMOVATE) 0.05 % Apply 1  application topically 2 (two) times daily. 45 g 0  . gabapentin (NEURONTIN) 300 MG capsule TAKE 1 CAPSULE BY MOUTH THREE TIMES A DAY 90 capsule 1  . ipratropium-albuterol (DUONEB) 0.5-2.5 (3) MG/3ML SOLN Take 3 mLs by nebulization every 4 (four) hours. 360 mL 12  . lamoTRIgine (LAMICTAL) 200 MG tablet Take 1 tablet (200 mg total) by mouth 2 (two) times daily. Delete prior refills 60 tablet 1  . meloxicam (MOBIC) 15 MG tablet TAKE 1 TABLET EVERY MORNING FOR 2 WEEKS THEN TAKE 1 TABLET EVERY DAY AS NEEDED 30 tablet 3  . naltrexone (DEPADE) 50 MG tablet TAKE 1 TABLET BY MOUTH ONCE DAILY 30 tablet 1  . omeprazole (PRILOSEC) 20 MG capsule TAKE ONE CAPSULE EVERY DAY 30 capsule 5  . OXcarbazepine (TRILEPTAL) 150 MG tablet TAKE ONE TABLET BY MOUTH 2 TIMES A DAY 60 tablet 0  . sertraline (ZOLOFT) 100 MG tablet TAKE TWO TABLETS BY MOUTH EVERY DAY 60 tablet 0  . Tiotropium Bromide-Olodaterol (STIOLTO RESPIMAT) 2.5-2.5 MCG/ACT AERS Inhale 2 puffs into the lungs daily. 4 g 5  . TUDORZA PRESSAIR 400 MCG/ACT AEPB     . [DISCONTINUED] risperiDONE (RISPERDAL) 2 MG tablet TAKE 1 TABLET (2 MG TOTAL) BY MOUTH AT BEDTIME.  2   No current facility-administered medications on file prior to visit.    Current Medications (verified) Current Outpatient Prescriptions  Medication Sig Dispense Refill  . albuterol (VENTOLIN HFA) 108 (90 BASE) MCG/ACT inhaler Inhale 2 puffs into the lungs every 6 (six) hours as  needed for wheezing or shortness of breath. 18 g 2  . atorvastatin (LIPITOR) 40 MG tablet Take 1 tablet (40 mg total) by mouth daily. 90 tablet 3  . buPROPion (WELLBUTRIN XL) 300 MG 24 hr tablet Take 1 tablet (300 mg total) by mouth daily. 30 tablet 1  . clobetasol ointment (TEMOVATE) 0.05 % Apply 1 application topically 2 (two) times daily. 45 g 0  . gabapentin (NEURONTIN) 300 MG capsule TAKE 1 CAPSULE BY MOUTH THREE TIMES A DAY 90 capsule 1  . ipratropium-albuterol (DUONEB) 0.5-2.5 (3) MG/3ML SOLN Take 3 mLs by  nebulization every 4 (four) hours. 360 mL 12  . lamoTRIgine (LAMICTAL) 200 MG tablet Take 1 tablet (200 mg total) by mouth 2 (two) times daily. Delete prior refills 60 tablet 1  . meloxicam (MOBIC) 15 MG tablet TAKE 1 TABLET EVERY MORNING FOR 2 WEEKS THEN TAKE 1 TABLET EVERY DAY AS NEEDED 30 tablet 3  . naltrexone (DEPADE) 50 MG tablet TAKE 1 TABLET BY MOUTH ONCE DAILY 30 tablet 1  . omeprazole (PRILOSEC) 20 MG capsule TAKE ONE CAPSULE EVERY DAY 30 capsule 5  . OXcarbazepine (TRILEPTAL) 150 MG tablet TAKE ONE TABLET BY MOUTH 2 TIMES A DAY 60 tablet 0  . sertraline (ZOLOFT) 100 MG tablet TAKE TWO TABLETS BY MOUTH EVERY DAY 60 tablet 0  . Tiotropium Bromide-Olodaterol (STIOLTO RESPIMAT) 2.5-2.5 MCG/ACT AERS Inhale 2 puffs into the lungs daily. 4 g 5  . TUDORZA PRESSAIR 400 MCG/ACT AEPB     . [DISCONTINUED] risperiDONE (RISPERDAL) 2 MG tablet TAKE 1 TABLET (2 MG TOTAL) BY MOUTH AT BEDTIME.  2   No current facility-administered medications for this visit.     Allergies (verified) Aspirin; Codeine; Carlos American; and Lanolin   PAST HISTORY  Family History Family History  Problem Relation Age of Onset  . Alcohol abuse Mother   . Cancer - Lung Father   . Alcohol abuse Father   . Depression Father   . Alcohol abuse Brother   . Stroke Brother   . Alcohol abuse Brother   . Stroke Brother   . Drug abuse Brother   . Stroke Brother   . Kidney failure Sister     Social History Social History  Substance Use Topics  . Smoking status: Current Every Day Smoker -- 1.50 packs/day for 51 years    Types: Cigarettes    Start date: 06/20/1961  . Smokeless tobacco: Not on file  . Alcohol Use: No     Comment: last drink over 5 weeks ago     Are there smokers in your home (other than you)? Yes  Risk Factors Current exercise habits: The patient does not participate in regular exercise at present.  Dietary issues discussed: None   Cardiac risk factors: advanced age (older than 57 for men, 64 for  women), hypertension, sedentary lifestyle and smoking/ tobacco exposure.  Depression Screen (Note: if answer to either of the following is "Yes", a more complete depression screening is indicated)   Over the past two weeks, have you felt down, depressed or hopeless? No  Over the past two weeks, have you felt little interest or pleasure in doing things? No  Have you lost interest or pleasure in daily life? No  Do you often feel hopeless? No  Do you cry easily over simple problems? No  Activities of Daily Living In your present state of health, do you have any difficulty performing the following activities?:  Driving? No Managing money?  No Feeding yourself?  No Getting from bed to chair? No Climbing a flight of stairs? No Preparing food and eating?: No Bathing or showering? No Getting dressed: No Getting to the toilet? No Using the toilet:No Moving around from place to place: No In the past year have you fallen or had a near fall?:No    Hearing Difficulties: No Do you often ask people to speak up or repeat themselves? Yes Do you experience ringing or noises in your ears? No Do you have difficulty understanding soft or whispered voices? No   Do you feel that you have a problem with memory? No  Do you often misplace items? No  Do you feel safe at home?  Yes  Cognitive Testing  Alert? Yes  Normal Appearance?Yes  Oriented to person? Yes  Place? Yes   Time? Yes  Recall of three objects?  Yes  Can perform simple calculations? Yes  Displays appropriate judgment?Yes  Can read the correct time from a watch face?Yes    6 CIT score of 10/28 ( significant )    Advanced Directives have been discussed with the patient? No  List the Names of Other Physician/Practitioners you currently use: 1.    Indicate any recent Medical Services you may have received from other than [facility] providers in the past year (date may be approximate).  Immunization History  Administered Date(s)  Administered  . Tdap 12/28/2012    Screening Tests Health Maintenance  Topic Date Due  . ZOSTAVAX  06/21/2011  . MAMMOGRAM  12/02/2015 (Originally 06/20/2001)  . INFLUENZA VACCINE  03/10/2016 (Originally 01/19/2015)  . PAP SMEAR  06/02/2016 (Originally 06/20/1972)  . COLONOSCOPY  06/02/2016 (Originally 06/20/2001)  . TETANUS/TDAP  12/29/2022  . Hepatitis C Screening  Completed  . HIV Screening  Completed    All answers were reviewed with the patient and necessary referrals were made:  Tashanna Dolin, MD   07/31/2015   History reviewed: allergies, current medications, past family history, past medical history, past social history, past surgical history and problem list  Review of Systems A comprehensive review of systems was negative.    Objective:     Vision by Snellen chart: right eye:20/70, left eye:20/50  Body mass index is 20.23 kg/(m^2). BP 126/56 mmHg  Pulse 93  Ht 5\' 6"  (1.676 m)  Wt 125 lb 4.8 oz (56.836 kg)  BMI 20.23 kg/m2  SpO2 98%  BP 126/56 mmHg  Pulse 93  Ht 5\' 6"  (1.676 m)  Wt 125 lb 4.8 oz (56.836 kg)  BMI 20.23 kg/m2  SpO2 98% General appearance: alert, cooperative and appears stated age Head: Normocephalic, without obvious abnormality, atraumatic Eyes: conj clear, EOMi, PEERLA Ears: right canal blocked by cerumen Nose: Nares normal. Septum midline. Mucosa normal. No drainage or sinus tenderness. Throat: lips, mucosa, and tongue normal; teeth and gums normal Neck: no adenopathy, no carotid bruit, no JVD, supple, symmetrical, trachea midline and thyroid not enlarged, symmetric, no tenderness/mass/nodules Back: symmetric, no curvature. ROM normal. No CVA tenderness. Lungs: clear to auscultation bilaterally Breasts: normal appearance, no masses or tenderness Heart: regular rate and rhythm, S1, S2 normal, no murmur, click, rub or gallop Abdomen: soft, non-tender; bowel sounds normal; no masses,  no organomegaly Extremities: extremities normal,  atraumatic, no cyanosis or edema Pulses: 2+ and symmetric Skin: Skin color, texture, turgor normal. No rashes or lesions Lymph nodes: Cervical, supraclavicular, and axillary nodes normal. Neurologic: Alert and oriented X 3, normal strength and tone. Normal symmetric reflexes. Normal coordination and gait     Assessment:  Medicare Wellness Exam       Plan:     During the course of the visit the patient was educated and counseled about appropriate screening and preventive services including:    declines pap smear   Discussed cologuard.  Form completed and faxed.   Declines all vaccinations.  Encouraged her to consider.   Diet review for nutrition referral? Yes ____  Not Indicated __X__   Patient Instructions (the written plan) was given to the patient.  Medicare Attestation I have personally reviewed: The patient's medical and social history Their use of alcohol, tobacco or illicit drugs Their current medications and supplements The patient's functional ability including ADLs,fall risks, home safety risks, cognitive, and hearing and visual impairment Diet and physical activities Evidence for depression or mood disorders  The patient's weight, height, BMI, and visual acuity have been recorded in the chart.  I have made referrals, counseling, and provided education to the patient based on review of the above and I have provided the patient with a written personalized care plan for preventive services.     Berkleigh Beckles, MD   07/31/2015

## 2015-07-31 NOTE — Patient Instructions (Signed)
Keep up a regular exercise program and make sure you are eating a healthy diet Try to eat 4 servings of dairy a day, or if you are lactose intolerant take a calcium with vitamin D daily.  Your vaccines are up to date.   

## 2015-08-05 NOTE — Addendum Note (Signed)
Addended by: Nani Gasser D on: 08/05/2015 05:16 PM   Modules accepted: Orders

## 2015-08-15 ENCOUNTER — Other Ambulatory Visit: Payer: Self-pay | Admitting: Family Medicine

## 2015-08-17 ENCOUNTER — Telehealth (HOSPITAL_COMMUNITY): Payer: Self-pay | Admitting: *Deleted

## 2015-08-17 MED ORDER — SERTRALINE HCL 100 MG PO TABS: 200.0000 mg | ORAL_TABLET | Freq: Every day | ORAL | Status: DC

## 2015-08-17 NOTE — Telephone Encounter (Signed)
Pt called for a refill for Zoloft . Per Dr. Gilmore Laroche, pt is authorized for a refill for Zoloft , # 60. Rx was sent to pharmacy. Pt is schedule for a f/u appt on 09/02/15. Called and informed pt of Rx status. Pt verbalizes understanding.

## 2015-08-27 ENCOUNTER — Ambulatory Visit: Payer: Self-pay | Admitting: Sports Medicine

## 2015-09-02 ENCOUNTER — Ambulatory Visit (HOSPITAL_COMMUNITY): Payer: Self-pay | Admitting: Psychiatry

## 2015-09-02 ENCOUNTER — Ambulatory Visit: Payer: Self-pay | Admitting: Family Medicine

## 2015-09-07 ENCOUNTER — Other Ambulatory Visit: Payer: Self-pay | Admitting: Family Medicine

## 2015-09-07 ENCOUNTER — Telehealth (HOSPITAL_COMMUNITY): Payer: Self-pay | Admitting: *Deleted

## 2015-09-07 DIAGNOSIS — F1021 Alcohol dependence, in remission: Secondary | ICD-10-CM

## 2015-09-07 DIAGNOSIS — F331 Major depressive disorder, recurrent, moderate: Secondary | ICD-10-CM

## 2015-09-07 DIAGNOSIS — F1020 Alcohol dependence, uncomplicated: Secondary | ICD-10-CM

## 2015-09-07 DIAGNOSIS — F1414 Cocaine abuse with cocaine-induced mood disorder: Secondary | ICD-10-CM

## 2015-09-07 DIAGNOSIS — F1210 Cannabis abuse, uncomplicated: Secondary | ICD-10-CM

## 2015-09-07 DIAGNOSIS — F3160 Bipolar disorder, current episode mixed, unspecified: Secondary | ICD-10-CM

## 2015-09-07 NOTE — Telephone Encounter (Addendum)
PT called for refills for Zoloft, Naltrexone, Lamictal,Trileptal and Wellbutrin. Pt is unable to afford cost of visit due to provider being out of network. Pt is currently searching for a provider in network, but will need a 30 day supply of medication.

## 2015-09-07 NOTE — Telephone Encounter (Signed)
Pt called to get refill on zoloft, burpropion, and naltrexone. Please send to [facility].  Call pt back at [phone].

## 2015-09-08 MED ORDER — NALTREXONE HCL 50 MG PO TABS: 50.0000 mg | ORAL_TABLET | Freq: Every day | ORAL | Status: DC

## 2015-09-08 MED ORDER — BUPROPION HCL ER (XL) 300 MG PO TB24: 300.0000 mg | ORAL_TABLET | Freq: Every day | ORAL | Status: DC

## 2015-09-08 MED ORDER — OXCARBAZEPINE 150 MG PO TABS: 150.0000 mg | ORAL_TABLET | Freq: Two times a day (BID) | ORAL | Status: DC

## 2015-09-08 MED ORDER — SERTRALINE HCL 100 MG PO TABS: 200.0000 mg | ORAL_TABLET | Freq: Every day | ORAL | Status: DC

## 2015-09-08 MED ORDER — LAMOTRIGINE 200 MG PO TABS: 200.0000 mg | ORAL_TABLET | Freq: Two times a day (BID) | ORAL | Status: DC

## 2015-09-08 NOTE — Telephone Encounter (Signed)
PT called for refills for Lamictal, Naltrexone, Trileptal, Wellbutrin and Zoloft. Per Dr. Gilmore LarocheAkhtar, medication request for Lamictal 200mg , #60, Naltrexone 50mg , #30, Trileptal 150mg , #60, Wellbutrin 300mg , #30 and Zoloft 200mg , #60 were sent to pharmacy. Please inform pt the refills are for 30 days only. No more refills will be issue unless pt is seen. Called and informed pt of prescription status. Pt verbalizes understanding.

## 2015-09-09 ENCOUNTER — Ambulatory Visit (INDEPENDENT_AMBULATORY_CARE_PROVIDER_SITE_OTHER): Payer: Commercial Managed Care - HMO | Admitting: Family Medicine

## 2015-09-09 ENCOUNTER — Encounter: Payer: Self-pay | Admitting: Family Medicine

## 2015-09-09 VITALS — BP 128/62 | HR 94 | Wt 115.0 lb

## 2015-09-09 DIAGNOSIS — R413 Other amnesia: Secondary | ICD-10-CM

## 2015-09-09 LAB — CBC WITH DIFFERENTIAL/PLATELET
BASOS ABS: 0.1 10*3/uL (ref 0.0–0.1)
Basophils Relative: 1 % (ref 0–1)
EOS ABS: 0.5 10*3/uL (ref 0.0–0.7)
EOS PCT: 7 % — AB (ref 0–5)
HEMATOCRIT: 40.5 % (ref 36.0–46.0)
Hemoglobin: 14.2 g/dL (ref 12.0–15.0)
LYMPHS ABS: 1.2 10*3/uL (ref 0.7–4.0)
LYMPHS PCT: 18 % (ref 12–46)
MCH: 32.3 pg (ref 26.0–34.0)
MCHC: 35.1 g/dL (ref 30.0–36.0)
MCV: 92.3 fL (ref 78.0–100.0)
MONO ABS: 0.6 10*3/uL (ref 0.1–1.0)
MONOS PCT: 9 % (ref 3–12)
MPV: 9.8 fL (ref 8.6–12.4)
Neutro Abs: 4.2 10*3/uL (ref 1.7–7.7)
Neutrophils Relative %: 65 % (ref 43–77)
PLATELETS: 354 10*3/uL (ref 150–400)
RBC: 4.39 MIL/uL (ref 3.87–5.11)
RDW: 12.3 % (ref 11.5–15.5)
WBC: 6.5 10*3/uL (ref 4.0–10.5)

## 2015-09-09 LAB — VITAMIN B12: Vitamin B-12: 413 pg/mL (ref 200–1100)

## 2015-09-09 LAB — TSH: TSH: 1.4 mIU/L
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
## 2015-09-10 LAB — RPR

## 2015-09-17 ENCOUNTER — Encounter (HOSPITAL_COMMUNITY): Payer: Self-pay | Admitting: Psychiatry

## 2015-09-17 ENCOUNTER — Ambulatory Visit (INDEPENDENT_AMBULATORY_CARE_PROVIDER_SITE_OTHER): Payer: Medicare HMO | Admitting: Psychiatry

## 2015-09-17 VITALS — BP 126/72 | HR 66 | Ht 66.0 in | Wt 114.0 lb

## 2015-09-17 DIAGNOSIS — F1414 Cocaine abuse with cocaine-induced mood disorder: Secondary | ICD-10-CM

## 2015-09-17 DIAGNOSIS — F3160 Bipolar disorder, current episode mixed, unspecified: Secondary | ICD-10-CM

## 2015-09-17 DIAGNOSIS — F410 Panic disorder [episodic paroxysmal anxiety] without agoraphobia: Secondary | ICD-10-CM

## 2015-09-17 DIAGNOSIS — F1290 Cannabis use, unspecified, uncomplicated: Secondary | ICD-10-CM

## 2015-09-17 DIAGNOSIS — F1020 Alcohol dependence, uncomplicated: Secondary | ICD-10-CM

## 2015-09-17 DIAGNOSIS — F1210 Cannabis abuse, uncomplicated: Secondary | ICD-10-CM

## 2015-09-17 DIAGNOSIS — F319 Bipolar disorder, unspecified: Secondary | ICD-10-CM | POA: Diagnosis not present

## 2015-09-17 DIAGNOSIS — F1021 Alcohol dependence, in remission: Secondary | ICD-10-CM

## 2015-09-17 DIAGNOSIS — G4700 Insomnia, unspecified: Secondary | ICD-10-CM

## 2015-09-17 DIAGNOSIS — F331 Major depressive disorder, recurrent, moderate: Secondary | ICD-10-CM

## 2015-09-17 MED ORDER — NALTREXONE HCL 50 MG PO TABS: 50.0000 mg | ORAL_TABLET | Freq: Every day | ORAL | Status: DC

## 2015-09-17 MED ORDER — BUPROPION HCL ER (XL) 300 MG PO TB24: 300.0000 mg | ORAL_TABLET | Freq: Every day | ORAL | Status: DC

## 2015-09-17 MED ORDER — OXCARBAZEPINE 150 MG PO TABS: 150.0000 mg | ORAL_TABLET | Freq: Two times a day (BID) | ORAL | Status: DC

## 2015-09-17 MED ORDER — SERTRALINE HCL 100 MG PO TABS: 200.0000 mg | ORAL_TABLET | Freq: Every day | ORAL | Status: DC

## 2015-09-17 MED ORDER — LAMOTRIGINE 200 MG PO TABS: 200.0000 mg | ORAL_TABLET | Freq: Two times a day (BID) | ORAL | Status: DC

## 2015-09-17 MED ORDER — GABAPENTIN 300 MG PO CAPS: 300.0000 mg | ORAL_CAPSULE | Freq: Every day | ORAL | Status: DC
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
## 2015-09-24 LAB — COLOGUARD: COLOGUARD: POSITIVE

## 2015-09-25 ENCOUNTER — Telehealth: Payer: Self-pay | Admitting: Family Medicine

## 2015-09-25 DIAGNOSIS — R195 Other fecal abnormalities: Secondary | ICD-10-CM

## 2015-09-25 NOTE — Telephone Encounter (Signed)
Call patient: The stool test that she did actually came back positive. This means that is concerning for colorectal cancer or advanced polyps. We need to refer her for screening colonoscopy. Please see if she has a preference for location and please enter referral.

## 2015-09-28 NOTE — Telephone Encounter (Signed)
Pt informed of results and would like referral. Pt would prefer to stay in [place]. Referral placed.

## 2015-09-30 ENCOUNTER — Encounter: Payer: Self-pay | Admitting: Family Medicine

## 2015-10-14 ENCOUNTER — Other Ambulatory Visit: Payer: Self-pay | Admitting: Family Medicine

## 2015-10-22 LAB — HM COLONOSCOPY

## 2015-11-03 ENCOUNTER — Telehealth (HOSPITAL_COMMUNITY): Payer: Self-pay | Admitting: *Deleted

## 2015-11-03 ENCOUNTER — Encounter: Payer: Self-pay | Admitting: Family Medicine

## 2015-11-03 MED ORDER — GABAPENTIN 300 MG PO CAPS: 300.0000 mg | ORAL_CAPSULE | Freq: Every day | ORAL | Status: DC

## 2015-11-03 NOTE — Telephone Encounter (Signed)
Pt called for a refill for Gabapentin 300mg . Per Dr. Gilmore LarocheAkhtar, medication request for Gabapentin 300mg ,#30 is authorized. Rx was sent to pharmacy. Pt is schedule for a f/u appt on 12/02/15. Called and informed pt of rx status. Pt verbalizes understanding.

## 2015-11-10 ENCOUNTER — Other Ambulatory Visit: Payer: Self-pay | Admitting: Family Medicine

## 2015-11-20 ENCOUNTER — Emergency Department (INDEPENDENT_AMBULATORY_CARE_PROVIDER_SITE_OTHER): Payer: Commercial Managed Care - HMO

## 2015-11-20 ENCOUNTER — Emergency Department (INDEPENDENT_AMBULATORY_CARE_PROVIDER_SITE_OTHER)
Admission: EM | Admit: 2015-11-20 | Discharge: 2015-11-20 | Disposition: A | Discharge status: Home / Self Care | Payer: Commercial Managed Care - HMO | Source: Home / Self Care | Attending: Family Medicine | Admitting: Family Medicine

## 2015-11-20 ENCOUNTER — Encounter: Payer: Self-pay | Admitting: *Deleted

## 2015-11-20 DIAGNOSIS — S40812A Abrasion of left upper arm, initial encounter: Secondary | ICD-10-CM | POA: Diagnosis not present

## 2015-11-20 DIAGNOSIS — R0781 Pleurodynia: Secondary | ICD-10-CM

## 2015-11-20 DIAGNOSIS — T07XXXA Unspecified multiple injuries, initial encounter: Secondary | ICD-10-CM

## 2015-11-20 DIAGNOSIS — T148 Other injury of unspecified body region: Secondary | ICD-10-CM | POA: Diagnosis not present

## 2015-11-20 HISTORY — DX: Emphysema, unspecified: J43.9

## 2015-11-20 MED ORDER — IBUPROFEN 600 MG PO TABS
600.0000 mg | ORAL_TABLET | Freq: Once | ORAL | Status: AC
  Administered 2015-11-20: 600 mg via ORAL

## 2015-11-20 NOTE — ED Notes (Signed)
Pt c/o LT rib pain post fall on 11/15/15. She reports that she has not taken any OTC meds for pain.

## 2015-11-20 NOTE — Discharge Instructions (Signed)
You may take acetaminophen 500mg -1000mg  every 4-6 hours for pain as well as Advil or generic ibuprofen 600mg  every 6-8 hours for pain.

## 2015-11-20 NOTE — ED Notes (Addendum)
Pt reports that she can take Advil/IBF without reaction. [name], LPN
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
## 2015-12-02 ENCOUNTER — Other Ambulatory Visit: Payer: Self-pay | Admitting: Family Medicine

## 2015-12-02 ENCOUNTER — Ambulatory Visit (INDEPENDENT_AMBULATORY_CARE_PROVIDER_SITE_OTHER): Payer: Commercial Managed Care - HMO | Admitting: Psychiatry

## 2015-12-02 ENCOUNTER — Telehealth: Payer: Self-pay

## 2015-12-02 ENCOUNTER — Encounter (HOSPITAL_COMMUNITY): Payer: Self-pay | Admitting: Psychiatry

## 2015-12-02 VITALS — BP 120/70 | HR 71 | Ht 66.0 in | Wt 111.2 lb

## 2015-12-02 DIAGNOSIS — F1290 Cannabis use, unspecified, uncomplicated: Secondary | ICD-10-CM

## 2015-12-02 DIAGNOSIS — F1021 Alcohol dependence, in remission: Secondary | ICD-10-CM

## 2015-12-02 DIAGNOSIS — F1210 Cannabis abuse, uncomplicated: Secondary | ICD-10-CM

## 2015-12-02 DIAGNOSIS — F1414 Cocaine abuse with cocaine-induced mood disorder: Secondary | ICD-10-CM

## 2015-12-02 DIAGNOSIS — F3160 Bipolar disorder, current episode mixed, unspecified: Secondary | ICD-10-CM

## 2015-12-02 DIAGNOSIS — F410 Panic disorder [episodic paroxysmal anxiety] without agoraphobia: Secondary | ICD-10-CM | POA: Diagnosis not present

## 2015-12-02 DIAGNOSIS — F331 Major depressive disorder, recurrent, moderate: Secondary | ICD-10-CM

## 2015-12-02 DIAGNOSIS — F1020 Alcohol dependence, uncomplicated: Secondary | ICD-10-CM

## 2015-12-02 DIAGNOSIS — G4700 Insomnia, unspecified: Secondary | ICD-10-CM | POA: Diagnosis not present

## 2015-12-02 DIAGNOSIS — F3130 Bipolar disorder, current episode depressed, mild or moderate severity, unspecified: Secondary | ICD-10-CM

## 2015-12-02 MED ORDER — MIRTAZAPINE 7.5 MG PO TABS: 7.5000 mg | ORAL_TABLET | Freq: Every day | ORAL | Status: DC

## 2015-12-02 MED ORDER — SERTRALINE HCL 100 MG PO TABS: 200.0000 mg | ORAL_TABLET | Freq: Every day | ORAL | Status: DC

## 2015-12-02 MED ORDER — BUPROPION HCL ER (XL) 300 MG PO TB24: 300.0000 mg | ORAL_TABLET | Freq: Every day | ORAL | Status: DC

## 2015-12-02 MED ORDER — NALTREXONE HCL 50 MG PO TABS: 50.0000 mg | ORAL_TABLET | Freq: Every day | ORAL | Status: DC

## 2015-12-02 MED ORDER — OXCARBAZEPINE 150 MG PO TABS: 150.0000 mg | ORAL_TABLET | Freq: Two times a day (BID) | ORAL | Status: DC

## 2015-12-02 MED ORDER — LAMOTRIGINE 200 MG PO TABS: 200.0000 mg | ORAL_TABLET | Freq: Two times a day (BID) | ORAL | Status: DC
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
## 2015-12-03 ENCOUNTER — Telehealth: Payer: Self-pay

## 2015-12-03 MED ORDER — ATORVASTATIN CALCIUM 40 MG PO TABS: 40.0000 mg | ORAL_TABLET | Freq: Every day | ORAL | Status: DC

## 2015-12-03 NOTE — Telephone Encounter (Signed)
Ok to fill 

## 2016-01-11 ENCOUNTER — Other Ambulatory Visit: Payer: Self-pay | Admitting: Family Medicine

## 2016-01-11 ENCOUNTER — Telehealth (HOSPITAL_COMMUNITY): Payer: Self-pay | Admitting: Psychiatry

## 2016-01-12 MED ORDER — MIRTAZAPINE 7.5 MG PO TABS: 7.5000 mg | ORAL_TABLET | Freq: Every day | ORAL | 0 refills | Status: DC

## 2016-01-12 NOTE — Telephone Encounter (Signed)
Patient phone into office to request a refill for Remeron. Return telephone call to pt. Pt states she received a 21 day supply of Remeron 7.5 mg on 12/14/15 and currently out of medication.   Called [facility], spoke w/ Shawna Orleans. Per Shawna Orleans, pt insurance requires all medications to be filled at the same time. Pt received a 21 day supply of Remeron on 12/14/15. Pt will need a new prescription sent to pharmacy.  Per Dr. Gilmore Laroche, pt is authorize for a refill for Remeron 7.5mg , #30. Rx was escribed to [facility]. Called and informed pt of refill status. Pt is schedule for a f/u appt on 01/28/16. Pt shows understanding.

## 2016-01-14 ENCOUNTER — Encounter: Payer: Self-pay | Admitting: *Deleted

## 2016-01-14 ENCOUNTER — Emergency Department (INDEPENDENT_AMBULATORY_CARE_PROVIDER_SITE_OTHER)
Admission: EM | Admit: 2016-01-14 | Discharge: 2016-01-14 | Disposition: A | Discharge status: Home / Self Care | Payer: Commercial Managed Care - HMO | Source: Home / Self Care | Attending: Family Medicine | Admitting: Family Medicine

## 2016-01-14 ENCOUNTER — Emergency Department (INDEPENDENT_AMBULATORY_CARE_PROVIDER_SITE_OTHER): Payer: Commercial Managed Care - HMO

## 2016-01-14 DIAGNOSIS — R079 Chest pain, unspecified: Secondary | ICD-10-CM

## 2016-01-14 DIAGNOSIS — S20212A Contusion of left front wall of thorax, initial encounter: Secondary | ICD-10-CM

## 2016-01-14 DIAGNOSIS — R0781 Pleurodynia: Secondary | ICD-10-CM

## 2016-01-14 LAB — POCT URINALYSIS DIP (MANUAL ENTRY)
BILIRUBIN UA: NEGATIVE
BILIRUBIN UA: NEGATIVE
Blood, UA: NEGATIVE
GLUCOSE UA: NEGATIVE
LEUKOCYTES UA: NEGATIVE
NITRITE UA: NEGATIVE
PH UA: 7 (ref 5–8)
PROTEIN UA: NEGATIVE
SPEC GRAV UA: 1.01 (ref 1.005–1.03)
Urobilinogen, UA: 0.2 (ref 0–1)

## 2016-01-14 MED ORDER — LIDOCAINE 5 % EX PTCH: 1.0000 | MEDICATED_PATCH | CUTANEOUS | 0 refills | Status: DC

## 2016-01-14 NOTE — Discharge Instructions (Signed)
Apply ice pack for 20 to 30 minutes, 3 to 4 times daily  Continue until pain decreases.  May wear rib binder for about two days if helpful.

## 2016-01-14 NOTE — ED Triage Notes (Signed)
Pt c/o left side pain without injury x last night. Worse this AM, denying any associated symptoms. H/o kidney stones.
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
## 2016-01-17 ENCOUNTER — Telehealth: Payer: Self-pay | Admitting: Emergency Medicine

## 2016-01-17 NOTE — Telephone Encounter (Signed)
Spoke w/pt, states that she is doing well, not having any problems.  Will f/u w/PCP.  TMartin,CMA

## 2016-01-27 ENCOUNTER — Ambulatory Visit (HOSPITAL_COMMUNITY): Payer: Self-pay | Admitting: Psychiatry

## 2016-01-28 ENCOUNTER — Ambulatory Visit (HOSPITAL_COMMUNITY): Payer: Self-pay | Admitting: Psychiatry

## 2016-01-29 ENCOUNTER — Encounter (HOSPITAL_COMMUNITY): Payer: Self-pay | Admitting: Psychiatry

## 2016-01-29 ENCOUNTER — Ambulatory Visit (HOSPITAL_COMMUNITY): Payer: Self-pay | Admitting: Psychiatry

## 2016-01-29 ENCOUNTER — Ambulatory Visit (INDEPENDENT_AMBULATORY_CARE_PROVIDER_SITE_OTHER): Payer: Commercial Managed Care - HMO | Admitting: Psychiatry

## 2016-01-29 DIAGNOSIS — IMO0002 Reserved for concepts with insufficient information to code with codable children: Secondary | ICD-10-CM

## 2016-01-29 DIAGNOSIS — F331 Major depressive disorder, recurrent, moderate: Secondary | ICD-10-CM | POA: Diagnosis not present

## 2016-01-29 DIAGNOSIS — F1099 Alcohol use, unspecified with unspecified alcohol-induced disorder: Secondary | ICD-10-CM

## 2016-01-29 DIAGNOSIS — G4700 Insomnia, unspecified: Secondary | ICD-10-CM

## 2016-01-29 DIAGNOSIS — F1210 Cannabis abuse, uncomplicated: Secondary | ICD-10-CM

## 2016-01-29 DIAGNOSIS — F3160 Bipolar disorder, current episode mixed, unspecified: Secondary | ICD-10-CM

## 2016-01-29 DIAGNOSIS — F1090 Alcohol use, unspecified, uncomplicated: Secondary | ICD-10-CM

## 2016-01-29 MED ORDER — BUPROPION HCL ER (XL) 150 MG PO TB24
150.0000 mg | ORAL_TABLET | Freq: Every day | ORAL | 2 refills | Status: DC
Start: 2016-01-29 — End: 2016-02-16

## 2016-01-29 MED ORDER — SERTRALINE HCL 100 MG PO TABS: 200.0000 mg | ORAL_TABLET | Freq: Every day | ORAL | 2 refills | Status: DC

## 2016-01-29 MED ORDER — MIRTAZAPINE 15 MG PO TABS: 7.5000 mg | ORAL_TABLET | Freq: Every day | ORAL | 2 refills | Status: DC

## 2016-01-29 MED ORDER — LAMOTRIGINE 200 MG PO TABS: 200.0000 mg | ORAL_TABLET | Freq: Two times a day (BID) | ORAL | 2 refills | Status: DC

## 2016-01-29 MED ORDER — NALTREXONE HCL 50 MG PO TABS: 50.0000 mg | ORAL_TABLET | Freq: Every day | ORAL | 2 refills | Status: DC

## 2016-01-29 NOTE — Patient Instructions (Signed)
Have been advising to stop alcohol and work on abstinence on marijuana. Patient have not done so. It is effecting medication benefit and she understands . She will look for another provider as her compliance with our recommendation has not been met . Will increase remeron '15mg'$  for now. Lower wellbutrin '150mg'$  for weight related concerns.  Patient will be given list of other providers or can choose someone locally as well Can continue therapy services here or elsewhere. Will print out refills adequate for 3 months.  Daughter was here and acknowledged in the treatment plan and that patient need to understand to change her habits related to marijuana and alcohol to get  benefit from medications.
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
## 2016-02-01 ENCOUNTER — Other Ambulatory Visit: Payer: Self-pay | Admitting: Family Medicine

## 2016-02-12 ENCOUNTER — Telehealth (HOSPITAL_COMMUNITY): Payer: Self-pay | Admitting: Psychiatry

## 2016-02-12 MED ORDER — OXCARBAZEPINE 150 MG PO TABS: 150.0000 mg | ORAL_TABLET | Freq: Two times a day (BID) | ORAL | 2 refills | Status: DC

## 2016-02-12 NOTE — Telephone Encounter (Signed)
Print out trileptal medication to be faxed or picked up.  Patient discharged. This medication also being printed out

## 2016-02-15 ENCOUNTER — Telehealth (HOSPITAL_COMMUNITY): Payer: Self-pay | Admitting: *Deleted

## 2016-02-15 NOTE — Telephone Encounter (Signed)
Rx printed on 01/29/16 for Lamictal, Wellbutrin, Naltrexone, Mirtazapine, and Zoloft. Pt called clinic on 8/25 to request medication refills. Informed pt Rx for Lamictal, Wellbutrin, Naltrexone, Mirtazapine and Zoloft were ready for pickup. Pt request to have Rx faxed to pharmacy. Per Dr. Gilmore LarocheAkhtar please fax Rx for Lamictal, Wellbutrin, Naltrexone, Mirtazapine and Zoloft   to [facility] on 02/12/16. Confirmation received at 12:06pm.

## 2016-02-16 ENCOUNTER — Encounter: Payer: Self-pay | Admitting: Family Medicine

## 2016-02-16 ENCOUNTER — Ambulatory Visit (INDEPENDENT_AMBULATORY_CARE_PROVIDER_SITE_OTHER): Payer: Commercial Managed Care - HMO | Admitting: Family Medicine

## 2016-02-16 VITALS — BP 136/54 | HR 71 | Wt 111.0 lb

## 2016-02-16 DIAGNOSIS — R21 Rash and other nonspecific skin eruption: Secondary | ICD-10-CM | POA: Diagnosis not present

## 2016-02-16 DIAGNOSIS — H938X1 Other specified disorders of right ear: Secondary | ICD-10-CM

## 2016-02-16 DIAGNOSIS — J42 Unspecified chronic bronchitis: Secondary | ICD-10-CM

## 2016-02-16 MED ORDER — TIOTROPIUM BROMIDE-OLODATEROL 2.5-2.5 MCG/ACT IN AERS: 2.0000 | INHALATION_SPRAY | Freq: Every day | RESPIRATORY_TRACT | 5 refills | Status: DC

## 2016-02-16 MED ORDER — OMEPRAZOLE 20 MG PO CPDR: 20.0000 mg | DELAYED_RELEASE_CAPSULE | Freq: Every day | ORAL | 11 refills | Status: DC

## 2016-02-16 MED ORDER — MELOXICAM 15 MG PO TABS: ORAL_TABLET | ORAL | 5 refills | Status: DC

## 2016-02-16 MED ORDER — NEOMYCIN-POLYMYXIN-HC 3.5-10000-1 OT SOLN
4.0000 [drp] | Freq: Four times a day (QID) | OTIC | 0 refills | Status: DC
Start: 2016-02-16 — End: 2016-03-10

## 2016-02-16 MED ORDER — CLOBETASOL PROPIONATE 0.05 % EX OINT: 1.0000 "application " | TOPICAL_OINTMENT | Freq: Two times a day (BID) | CUTANEOUS | 1 refills | Status: DC

## 2016-02-16 MED ORDER — ALBUTEROL SULFATE HFA 108 (90 BASE) MCG/ACT IN AERS: 2.0000 | INHALATION_SPRAY | Freq: Four times a day (QID) | RESPIRATORY_TRACT | 2 refills | Status: DC | PRN

## 2016-02-16 NOTE — Progress Notes (Signed)
Subjective:    CC:   HPI:  Right ear fullness - started a few days ago. Denies any pain. Feels like can't hear out of her ear.    Rash on hand/likely eczema - will refill her topical steroid.  She says that it actually worked really well on her eczema. She said she would usually use it for about a week and it would clear up the rash but then eventually after week or 2 with come back. She said she ran out of the cream several weeks ago.  COPD - recently had a cold over the last couple weeks that she got from her grandson. She says she still coughing but overall feeling much better. She has been using her inhalers regularly. She says it wasn't bad enough for her to break out her nebulizer machine.  Past medical history, Surgical history, Family history not pertinant except as noted below, Social history, Allergies, and medications have been entered into the medical record, reviewed, and corrections made.   Review of Systems: No fevers, chills, night sweats, weight loss, chest pain, or shortness of breath.   Objective:    General: Well Developed, well nourished, and in no acute distress.  Neuro: Alert and oriented x3, extra-ocular muscles intact, sensation grossly intact.  HEENT: Normocephalic, atraumatic  Skin: Warm and dry, no rashes. Cardiac: Regular rate and rhythm, no murmurs rubs or gallops, no lower extremity edema.  Respiratory: Coarse breath sounds with a few rhonchi at the bases bilaterally. No crackles.   Impression and Recommendations:   Rash on hand/likely eczema - Will refill the betamethasone cream. New perception sent to the pharmacy.  Right ear fullness - exam Consistent with otitis externa. Will treat with drops. Call if not better by the end of the week. We did irrigate the ear to remove most of the debris.  COPD-stable with recent exacerbation because of upper respiratory illness. She did not require antibiotics or steroids and is actually feeling better on her  own.

## 2016-03-10 ENCOUNTER — Encounter: Payer: Self-pay | Admitting: Family Medicine

## 2016-03-10 ENCOUNTER — Telehealth: Payer: Self-pay | Admitting: Family Medicine

## 2016-03-10 ENCOUNTER — Ambulatory Visit (INDEPENDENT_AMBULATORY_CARE_PROVIDER_SITE_OTHER): Payer: Medicare Other | Admitting: Family Medicine

## 2016-03-10 VITALS — BP 149/79 | HR 68 | Ht 66.0 in | Wt 113.0 lb

## 2016-03-10 DIAGNOSIS — J42 Unspecified chronic bronchitis: Secondary | ICD-10-CM

## 2016-03-10 DIAGNOSIS — I10 Essential (primary) hypertension: Secondary | ICD-10-CM

## 2016-03-10 DIAGNOSIS — F3130 Bipolar disorder, current episode depressed, mild or moderate severity, unspecified: Secondary | ICD-10-CM | POA: Diagnosis not present

## 2016-03-10 DIAGNOSIS — F332 Major depressive disorder, recurrent severe without psychotic features: Secondary | ICD-10-CM | POA: Diagnosis not present

## 2016-03-10 DIAGNOSIS — S60511A Abrasion of right hand, initial encounter: Secondary | ICD-10-CM

## 2016-03-10 DIAGNOSIS — S50311A Abrasion of right elbow, initial encounter: Secondary | ICD-10-CM

## 2016-03-10 DIAGNOSIS — R296 Repeated falls: Secondary | ICD-10-CM

## 2016-03-10 NOTE — Telephone Encounter (Signed)
Please call patient: Blood pressure was up a little bit today. Please see if she might be able to come by couple weeks for just a nurse visit for a blood pressure check.

## 2016-03-10 NOTE — Telephone Encounter (Signed)
Please see note below. In addition we really need to get her scheduled for spirometry which is a breathing test

## 2016-03-10 NOTE — Progress Notes (Addendum)
Subjective:    CC: COPD, BP  HPI:  Hypertension- Pt denies chest pain, SOB, dizziness, or heart palpitations.  Taking meds as directed w/o problems.  Denies medication side effects.    COPD - she has been doing fair.  She has been using her stiolto BID and says she has been trying to do it very consistently.   Bipolar D/O - She says she is going to find a new psychiatrist. He did give her refills for the next 3 months but she has not made phone calls to get a new psychiatrist lined up. He says she's been waiting for her new doctor coming from her insurance company that lists providers here in network.She still feels down and sad and depressed. She is still smoking marijuana.    She fell yesterday while walking her dog. He yanked and she fell and abraded her right elbow and right palm of hand. Says it bled a lot.  Very tender to touch. Says she fell in her bathtub earlier this week.     Past medical history, Surgical history, Family history not pertinant except as noted below, Social history, Allergies, and medications have been entered into the medical record, reviewed, and corrections made.   Review of Systems: No fevers, chills, night sweats, weight loss, chest pain, or shortness of breath.   Objective:    General: Well Developed, well nourished, and in no acute distress.  Neuro: Alert and oriented x3, extra-ocular muscles intact, sensation grossly intact.  HEENT: Normocephalic, atraumatic  Skin: Warm and dry, no rashes. She has a 3 x 4 cm abrasion on the outer right elbow. She also has a larger abrasion on the palm of her right hand.  Cardiac: Regular rate and rhythm, no murmurs rubs or gallops, no lower extremity edema.  Respiratory: Clear to auscultation bilaterally. Not using accessory muscles, speaking in full sentences.   Impression and Recommendations:    HTN - Uncontrolled today. Normall at goal. Will recheck in a couple of weeks. . Continue current regimen. Follow up in  6  mo.   COPD - CAT score of 27.  Stable. No recent exacerbations. Continue with Stiolto. Consider adding an inhaled corticosteroid if symptoms persist.  Frequent falls - offered to enroll her in a fall program through PT but she declined. She does have transportation issues a Jeanella AntonReece could consider getting the services through home health. She will think about it.  Bipolar D/O - strongly encouraged her to go ahead and call her insurance and find out who is in her network in the local community and go ahead and call to schedule an appointment. Here in the local area it will likely take 6-8 weeks could to get a new patient appointment.  Abrasion, right elbow and right hand - discussed wearing care. Applied nonadhesive gauze and wrapped with Coban.  Applied neosporin ointment to the wound.

## 2016-03-15 ENCOUNTER — Other Ambulatory Visit: Payer: Self-pay | Admitting: Family Medicine

## 2016-03-15 NOTE — Telephone Encounter (Signed)
Pt advised that she will need to make an appt for spirometry and bp. She will call back to schedule.Loralee PacasBarkley, Mehar Sagen OdumLynetta

## 2016-04-27 ENCOUNTER — Ambulatory Visit (HOSPITAL_COMMUNITY): Payer: Commercial Managed Care - HMO | Admitting: Psychiatry

## 2016-05-18 ENCOUNTER — Telehealth: Payer: Self-pay | Admitting: Family Medicine

## 2016-05-18 NOTE — Telephone Encounter (Signed)
Ladean Rayaonstance, FNP with  [employer] called. She called to report that Ms. Belsito's bp in her rt arm was 160/170 and in her left arm it was 170/80 and this was  around 10:30 or 11:00 this morning. She left a message but nobody returned her call.

## 2016-06-08 ENCOUNTER — Encounter: Payer: Self-pay | Admitting: Physician Assistant

## 2016-06-08 ENCOUNTER — Ambulatory Visit (INDEPENDENT_AMBULATORY_CARE_PROVIDER_SITE_OTHER): Payer: Medicare Other | Admitting: Physician Assistant

## 2016-06-08 ENCOUNTER — Telehealth: Payer: Self-pay | Admitting: Family Medicine

## 2016-06-08 VITALS — BP 173/77 | HR 75 | Ht 66.0 in | Wt 110.0 lb

## 2016-06-08 DIAGNOSIS — R296 Repeated falls: Secondary | ICD-10-CM

## 2016-06-08 DIAGNOSIS — F410 Panic disorder [episodic paroxysmal anxiety] without agoraphobia: Secondary | ICD-10-CM

## 2016-06-08 DIAGNOSIS — F3130 Bipolar disorder, current episode depressed, mild or moderate severity, unspecified: Secondary | ICD-10-CM

## 2016-06-08 DIAGNOSIS — F332 Major depressive disorder, recurrent severe without psychotic features: Secondary | ICD-10-CM

## 2016-06-08 NOTE — Telephone Encounter (Signed)
Referral placed.Sheila Beck  

## 2016-06-08 NOTE — Telephone Encounter (Signed)
Ok to place referral.

## 2016-06-08 NOTE — Telephone Encounter (Signed)
Sheila Beck daughter brought her in for an appointment with Lesly RubensteinJade and while here stated her mom used to see our Behavioral Health downstairs but they have parted ways and she would like a referral to go to [facility] for her mom to be seen. - CF

## 2016-06-08 NOTE — Patient Instructions (Addendum)
[facility] consider this.

## 2016-06-09 ENCOUNTER — Telehealth (HOSPITAL_COMMUNITY): Payer: Self-pay | Admitting: Psychiatry

## 2016-06-09 NOTE — Telephone Encounter (Signed)
Patient can be transferred to Dr. Vanetta ShawlHisada in [place]. She is looking for a Female psychiatrist

## 2016-06-11 NOTE — Progress Notes (Signed)
   Subjective:    Patient ID: Sheila BowlerLinda S Beck, female    DOB: 12/17/1950, 65 y.o.   MRN: [id number]  HPI Pt is a 65 yo female who presents to the clinic to discuss frequent falls that have been going on for over a year.  She is a recovering addict from drugs and alcohol. She denies any use. She does smoke marijuana daily. She is not aware of any medication changes that could have increased falls. She does not walk with a cane or walker. She denies any nausea, ringing in ears, sinus pressure. She does not that positional changes is usually when she falls. She denies any numbness or tingling in extremities.   She has bipolar and not happy with current provider. She feels like her medications need to be changed but he is not willing too.   Review of Systems  All other systems reviewed and are negative.      Objective:   Physical Exam  Constitutional: She is oriented to person, place, and time. She appears well-developed and well-nourished.  HENT:  Head: Normocephalic and atraumatic.  Right Ear: External ear normal.  Left Ear: External ear normal.  Nose: Nose normal.  Mouth/Throat: Oropharynx is clear and moist. No oropharyngeal exudate.  Eyes: Conjunctivae and EOM are normal. Right eye exhibits no discharge. Left eye exhibits no discharge.  Neck: Normal range of motion. Neck supple.  Cardiovascular: Normal rate, regular rhythm and normal heart sounds.   Pulmonary/Chest: Effort normal and breath sounds normal.  Lymphadenopathy:    She has no cervical adenopathy.  Neurological: She is alert and oriented to person, place, and time. Coordination abnormal.  Positive Dix hallpike to the right only with horizontal nystagmus.  Rapid alternating movements intact but slow.  Find tremor noted on exam with bilateral hands and head.  Gait impaired.   Psychiatric: She has a normal mood and affect. Her behavior is normal.          Assessment & Plan:  Marland Kitchen.Marland Kitchen.Sheila Beck was seen today for dizziness and  fall.  Diagnoses and all orders for this visit:  Frequent falls   Given epley maneuvers because there does appear to be some degree of  BPV to the right. Discussed using a cane for fall prevention. Consider PT for vestibular rehab vs general stability.  Reassured patient of no significant abnormality found on neuro exam.  Follow up with PCP in 2 weeks.

## 2016-06-21 DIAGNOSIS — H2513 Age-related nuclear cataract, bilateral: Secondary | ICD-10-CM | POA: Diagnosis not present

## 2016-06-29 ENCOUNTER — Telehealth (HOSPITAL_COMMUNITY): Payer: Self-pay | Admitting: Medical

## 2016-06-29 ENCOUNTER — Telehealth: Payer: Self-pay

## 2016-06-29 ENCOUNTER — Other Ambulatory Visit: Payer: Self-pay | Admitting: Family Medicine

## 2016-06-29 ENCOUNTER — Ambulatory Visit: Payer: Self-pay | Admitting: Family Medicine

## 2016-06-29 ENCOUNTER — Other Ambulatory Visit: Payer: Self-pay

## 2016-06-29 DIAGNOSIS — F3160 Bipolar disorder, current episode mixed, unspecified: Secondary | ICD-10-CM

## 2016-06-29 DIAGNOSIS — F331 Major depressive disorder, recurrent, moderate: Secondary | ICD-10-CM

## 2016-06-29 MED ORDER — OXCARBAZEPINE 150 MG PO TABS: 150.0000 mg | ORAL_TABLET | Freq: Two times a day (BID) | ORAL | 0 refills | Status: DC

## 2016-06-29 MED ORDER — SERTRALINE HCL 100 MG PO TABS: 200.0000 mg | ORAL_TABLET | Freq: Every day | ORAL | 0 refills | Status: DC

## 2016-06-29 MED ORDER — BUPROPION HCL ER (XL) 300 MG PO TB24: 300.0000 mg | ORAL_TABLET | Freq: Every day | ORAL | 0 refills | Status: DC

## 2016-06-29 MED ORDER — MIRTAZAPINE 15 MG PO TABS: 7.5000 mg | ORAL_TABLET | Freq: Every day | ORAL | 0 refills | Status: DC

## 2016-06-29 MED ORDER — LAMOTRIGINE 200 MG PO TABS: 200.0000 mg | ORAL_TABLET | Freq: Two times a day (BID) | ORAL | 0 refills | Status: DC

## 2016-06-29 NOTE — Telephone Encounter (Signed)
Ok to refill for 30 days. Please call [facility] in [place] and see if taking new patients.

## 2016-06-29 NOTE — Telephone Encounter (Signed)
Ernst BowlerLinda S Cancio called questioning if METHENEY,CATHERINE, MD would be willing to refill medications previously managed by psych stating that she is not able to establish with Psychiatry due to them not taking new patients. She does not drive and is unable to travel outside of [place] and [place]. Pt is crying stating that she is completely out of her medications and unsure of what to do going forward. Please advise. Please route response to Marcy SalvoKelsi Richardson or [facility] [employer]ccrimmon as I will not be in triage this afternoon.

## 2016-06-29 NOTE — Telephone Encounter (Signed)
Spoke with Pt to see what Rx refills are needed. Advised these will only be for 30 day supply. Called [facility] in [place], they do not accept [employer]'s insurance. Advised Pt of this. Pt states she is open to seeing Kobar, PA-C downstairs at our practice. Placed Pt on hold and called down to see if this was an option. A note has been sent to [facility] for review. Since Pt was discharged due to noncompliance on pain management contract, it will have to be approved and she may have to go through their specialize program out of the [place] office. Informed Pt of this information, refuses to go to [place]. Advised Pt we are going to send over a one month supply but she needs to find a Practice that will accept her insurance. verblized understanding. Refills approved.

## 2016-06-29 NOTE — Telephone Encounter (Signed)
Will discuss tomorrow. 

## 2016-06-29 NOTE — Telephone Encounter (Signed)
Daughter walked into office today. (HPOA on file) Patient was discharged from dr. Gilmore Larocheakhtar in August due to non compliance with recommendations. Daughter has been calling to several office to see if anyone can see her mom. All offices in the [place] area are not taking new patient with her insurance.  Daughter would like to know if Leonette MostCharles would be willing to see her.   Please advise.

## 2016-06-30 NOTE — Telephone Encounter (Signed)
Per Maryjean Morncharles kober: patient will need to be seen [facility] [place] [facility]. We can not see patient here. Patient can establish with Dr. Vanetta ShawlHisada.  [facility] was [facility] for [facility]-compliance.  Informed daughter of this information. Nothing further needed at this time.
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
## 2016-07-06 ENCOUNTER — Ambulatory Visit (HOSPITAL_COMMUNITY): Payer: Self-pay | Admitting: Psychiatry
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
## 2016-07-13 ENCOUNTER — Encounter (HOSPITAL_COMMUNITY): Payer: Self-pay | Admitting: Psychiatry

## 2016-07-13 ENCOUNTER — Ambulatory Visit (INDEPENDENT_AMBULATORY_CARE_PROVIDER_SITE_OTHER): Payer: Medicaid Other | Admitting: Psychiatry

## 2016-07-13 DIAGNOSIS — Z79899 Other long term (current) drug therapy: Secondary | ICD-10-CM

## 2016-07-13 DIAGNOSIS — Z811 Family history of alcohol abuse and dependence: Secondary | ICD-10-CM

## 2016-07-13 DIAGNOSIS — Z818 Family history of other mental and behavioral disorders: Secondary | ICD-10-CM

## 2016-07-13 DIAGNOSIS — Z801 Family history of malignant neoplasm of trachea, bronchus and lung: Secondary | ICD-10-CM

## 2016-07-13 DIAGNOSIS — Z888 Allergy status to other drugs, medicaments and biological substances status: Secondary | ICD-10-CM

## 2016-07-13 DIAGNOSIS — Z841 Family history of disorders of kidney and ureter: Secondary | ICD-10-CM

## 2016-07-13 DIAGNOSIS — F17210 Nicotine dependence, cigarettes, uncomplicated: Secondary | ICD-10-CM

## 2016-07-13 DIAGNOSIS — F331 Major depressive disorder, recurrent, moderate: Secondary | ICD-10-CM

## 2016-07-13 DIAGNOSIS — Z823 Family history of stroke: Secondary | ICD-10-CM

## 2016-07-13 MED ORDER — NALTREXONE HCL 50 MG PO TABS: 50.0000 mg | ORAL_TABLET | Freq: Every day | ORAL | 0 refills | Status: DC

## 2016-07-13 MED ORDER — QUETIAPINE FUMARATE 25 MG PO TABS: 25.0000 mg | ORAL_TABLET | Freq: Every day | ORAL | 0 refills | Status: DC

## 2016-07-13 MED ORDER — MIRTAZAPINE 15 MG PO TABS: 15.0000 mg | ORAL_TABLET | Freq: Every day | ORAL | 0 refills | Status: DC

## 2016-07-13 MED ORDER — BUPROPION HCL ER (XL) 300 MG PO TB24: 300.0000 mg | ORAL_TABLET | Freq: Every day | ORAL | 0 refills | Status: DC

## 2016-07-13 MED ORDER — LAMOTRIGINE 200 MG PO TABS: 200.0000 mg | ORAL_TABLET | Freq: Two times a day (BID) | ORAL | 0 refills | Status: DC

## 2016-07-13 NOTE — Patient Instructions (Addendum)
1. Start quetiapine 25 mg at night 2. Discontinue oxycarbazepine 3. Increase mirtazapine 15 mg at night 4. Decrease sertraline 150 mg daily for one week, then 100 mg daily for one week, then 50 mg daily  5. Continue Bupropion 300 mg daily 6. Continue lamotrigine 200 mg twice a day 7. Continue naltrexone 50 mg daily 8. Return to clinic in one month

## 2016-07-28 ENCOUNTER — Other Ambulatory Visit (HOSPITAL_COMMUNITY): Payer: Self-pay | Admitting: Psychiatry

## 2016-07-28 ENCOUNTER — Telehealth (HOSPITAL_COMMUNITY): Payer: Self-pay

## 2016-07-28 MED ORDER — ESCITALOPRAM OXALATE 10 MG PO TABS: 10.0000 mg | ORAL_TABLET | Freq: Every day | ORAL | 1 refills | Status: DC

## 2016-07-28 NOTE — Telephone Encounter (Signed)
Patient reports worsening depression. She had passive SI on Monday. Patient is on sertraline 50 mg daily from this week.   Will cross taper from sertraline to lexapro. Will start lexapro 10 mg daily for two weeks, then 20 mg daily. (She is advised to discontinue sertraline next week). Patient is advised to contact the clinic if any worsening in her symptoms. Emergency resources which includes 911, ED, suicide crisis line [phone]) are discussed.

## 2016-07-28 NOTE — Telephone Encounter (Signed)
Patient is calling and she says her depression is worse since her medication changes at her last visit. She has a follow up later this month, but says that she has been locked up in her house for 4 days, crying, depressed and taking it out on her dog. She wants a med change as soon as possible. Please review and advise, thank you

## 2016-08-09 ENCOUNTER — Other Ambulatory Visit: Payer: Self-pay | Admitting: Family Medicine

## 2016-08-10 ENCOUNTER — Other Ambulatory Visit (HOSPITAL_COMMUNITY): Payer: Self-pay

## 2016-08-10 DIAGNOSIS — F331 Major depressive disorder, recurrent, moderate: Secondary | ICD-10-CM

## 2016-08-10 MED ORDER — NALTREXONE HCL 50 MG PO TABS: 50.0000 mg | ORAL_TABLET | Freq: Every day | ORAL | 0 refills | Status: DC

## 2016-08-10 MED ORDER — BUPROPION HCL ER (XL) 300 MG PO TB24: 300.0000 mg | ORAL_TABLET | Freq: Every day | ORAL | 0 refills | Status: DC

## 2016-08-10 MED ORDER — LAMOTRIGINE 200 MG PO TABS: 200.0000 mg | ORAL_TABLET | Freq: Two times a day (BID) | ORAL | 0 refills | Status: DC

## 2016-08-15 ENCOUNTER — Other Ambulatory Visit (HOSPITAL_COMMUNITY): Payer: Self-pay

## 2016-08-15 DIAGNOSIS — F331 Major depressive disorder, recurrent, moderate: Secondary | ICD-10-CM

## 2016-08-15 MED ORDER — MIRTAZAPINE 15 MG PO TABS: 15.0000 mg | ORAL_TABLET | Freq: Every day | ORAL | 0 refills | Status: DC

## 2016-08-15 MED ORDER — BUPROPION HCL ER (XL) 300 MG PO TB24
300.0000 mg | ORAL_TABLET | Freq: Every day | ORAL | 0 refills | Status: DC
Start: 2016-08-15 — End: 2016-08-17

## 2016-08-15 MED ORDER — LAMOTRIGINE 200 MG PO TABS: 200.0000 mg | ORAL_TABLET | Freq: Two times a day (BID) | ORAL | 0 refills | Status: DC

## 2016-08-15 MED ORDER — QUETIAPINE FUMARATE 25 MG PO TABS: 25.0000 mg | ORAL_TABLET | Freq: Every day | ORAL | 0 refills | Status: DC

## 2016-08-15 MED ORDER — ESCITALOPRAM OXALATE 10 MG PO TABS: 10.0000 mg | ORAL_TABLET | Freq: Every day | ORAL | 1 refills | Status: DC

## 2016-08-15 MED ORDER — NALTREXONE HCL 50 MG PO TABS: 50.0000 mg | ORAL_TABLET | Freq: Every day | ORAL | 0 refills | Status: DC
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
## 2016-08-17 ENCOUNTER — Ambulatory Visit (HOSPITAL_COMMUNITY): Payer: Self-pay | Admitting: Psychiatry

## 2016-08-17 ENCOUNTER — Ambulatory Visit (INDEPENDENT_AMBULATORY_CARE_PROVIDER_SITE_OTHER): Payer: Medicaid Other | Admitting: Psychiatry

## 2016-08-17 ENCOUNTER — Encounter (HOSPITAL_COMMUNITY): Payer: Self-pay | Admitting: Psychiatry

## 2016-08-17 VITALS — BP 138/66 | HR 58 | Ht 65.0 in | Wt 113.2 lb

## 2016-08-17 DIAGNOSIS — Z79899 Other long term (current) drug therapy: Secondary | ICD-10-CM | POA: Diagnosis not present

## 2016-08-17 DIAGNOSIS — Z818 Family history of other mental and behavioral disorders: Secondary | ICD-10-CM

## 2016-08-17 DIAGNOSIS — F17210 Nicotine dependence, cigarettes, uncomplicated: Secondary | ICD-10-CM | POA: Diagnosis not present

## 2016-08-17 DIAGNOSIS — F331 Major depressive disorder, recurrent, moderate: Secondary | ICD-10-CM | POA: Diagnosis not present

## 2016-08-17 DIAGNOSIS — F1290 Cannabis use, unspecified, uncomplicated: Secondary | ICD-10-CM | POA: Diagnosis not present

## 2016-08-17 DIAGNOSIS — Z811 Family history of alcohol abuse and dependence: Secondary | ICD-10-CM | POA: Diagnosis not present

## 2016-08-17 DIAGNOSIS — F1099 Alcohol use, unspecified with unspecified alcohol-induced disorder: Secondary | ICD-10-CM | POA: Diagnosis not present

## 2016-08-17 DIAGNOSIS — Z813 Family history of other psychoactive substance abuse and dependence: Secondary | ICD-10-CM

## 2016-08-17 DIAGNOSIS — Z888 Allergy status to other drugs, medicaments and biological substances status: Secondary | ICD-10-CM | POA: Diagnosis not present

## 2016-08-17 DIAGNOSIS — IMO0002 Reserved for concepts with insufficient information to code with codable children: Secondary | ICD-10-CM | POA: Insufficient documentation

## 2016-08-17 MED ORDER — NALTREXONE HCL 50 MG PO TABS
50.0000 mg | ORAL_TABLET | Freq: Every day | ORAL | 0 refills | Status: DC
Start: 2016-08-17 — End: 2016-09-14

## 2016-08-17 MED ORDER — MIRTAZAPINE 30 MG PO TABS: 30.0000 mg | ORAL_TABLET | Freq: Every day | ORAL | 0 refills | Status: DC

## 2016-08-17 MED ORDER — QUETIAPINE FUMARATE 25 MG PO TABS: 25.0000 mg | ORAL_TABLET | Freq: Every day | ORAL | 0 refills | Status: DC

## 2016-08-17 MED ORDER — BUPROPION HCL ER (XL) 300 MG PO TB24: 300.0000 mg | ORAL_TABLET | Freq: Every day | ORAL | 0 refills | Status: DC

## 2016-08-17 MED ORDER — LAMOTRIGINE 100 MG PO TABS: 100.0000 mg | ORAL_TABLET | Freq: Two times a day (BID) | ORAL | 0 refills | Status: DC

## 2016-08-17 NOTE — Patient Instructions (Signed)
1. Continue quetiapine 25 mg at night 2. Increase mirtazapine 30 mg at night 3. Continue Bupropion 300 mg daily 4. Decrease lamotrigine 100 mg twice a day 5. Continue naltrexone 50 mg daily 6. Return to clinic in one month

## 2016-09-07 ENCOUNTER — Encounter: Payer: Self-pay | Admitting: Family Medicine

## 2016-09-07 ENCOUNTER — Ambulatory Visit (INDEPENDENT_AMBULATORY_CARE_PROVIDER_SITE_OTHER): Payer: Medicare Other | Admitting: Family Medicine

## 2016-09-07 VITALS — BP 124/72 | HR 91 | Ht 65.0 in | Wt 115.0 lb

## 2016-09-07 DIAGNOSIS — N183 Chronic kidney disease, stage 3 unspecified: Secondary | ICD-10-CM

## 2016-09-07 DIAGNOSIS — I10 Essential (primary) hypertension: Secondary | ICD-10-CM

## 2016-09-07 DIAGNOSIS — J42 Unspecified chronic bronchitis: Secondary | ICD-10-CM | POA: Diagnosis not present

## 2016-09-07 DIAGNOSIS — G4709 Other insomnia: Secondary | ICD-10-CM

## 2016-09-07 LAB — LIPID PANEL W/REFLEX DIRECT LDL
CHOLESTEROL: 161 mg/dL (ref <200)
HDL: 78 mg/dL (ref >50)
LDL-Cholesterol: 65 mg/dL
Non-HDL Cholesterol (Calc): 83 mg/dL (ref <130)
Total CHOL/HDL Ratio: 2.1 Ratio (ref <5.0)
Triglycerides: 99 mg/dL (ref <150)

## 2016-09-07 LAB — COMPLETE METABOLIC PANEL WITH GFR
ALK PHOS: 74 U/L (ref 33–130)
ALT: 12 U/L (ref 6–29)
AST: 15 U/L (ref 10–35)
Albumin: 4.6 g/dL (ref 3.6–5.1)
BILIRUBIN TOTAL: 0.4 mg/dL (ref 0.2–1.2)
BUN: 12 mg/dL (ref 7–25)
CO2: 30 mmol/L (ref 20–31)
CREATININE: 1.19 mg/dL — AB (ref 0.50–0.99)
Calcium: 9.7 mg/dL (ref 8.6–10.4)
Chloride: 100 mmol/L (ref 98–110)
GFR, EST AFRICAN AMERICAN: 55 mL/min — AB (ref >=60)
GFR, EST NON AFRICAN AMERICAN: 48 mL/min — AB (ref >=60)
Glucose, Bld: 96 mg/dL (ref 65–99)
Potassium: 4.4 mmol/L (ref 3.5–5.3)
SODIUM: 137 mmol/L (ref 135–146)
TOTAL PROTEIN: 7.2 g/dL (ref 6.1–8.1)

## 2016-09-07 LAB — TSH: TSH: 1.54 mIU/L

## 2016-09-07 NOTE — Progress Notes (Signed)
Subjective:    CC: COPD, HTN  HPI:  Hypertension- Pt denies chest pain, SOB, dizziness, or heart palpitations.  Taking meds as directed w/o problems.  Denies medication side effects.    COPD -   Doing well overall. She has not had any exacerbations recently. She says she uses her inhalers regularly and has not had a pullout her nebulizer. She doesn't have a little bit more problem with sinus symptoms and sinus infections in the spring but that has not started yet.  CKD 3 - no  changes in urination.  She still getting occasional dizzy spells and feeling weak at times. But she also admits she's not sleeping well. She is only getting about 3-4 hours at night but she often drinks 2-3 pots of coffee a day.  She does report that her eye exam is up-to-date. She saw Dr. Gar Gibbonexter.  Past medical history, Surgical history, Family history not pertinant except as noted below, Social history, Allergies, and medications have been entered into the medical record, reviewed, and corrections made.   Review of Systems: No fevers, chills, night sweats, weight loss, chest pain, or shortness of breath.   Objective:    General: Well Developed, well nourished, and in no acute distress.  Neuro: Alert and oriented x3, extra-ocular muscles intact, sensation grossly intact.  HEENT: Normocephalic, atraumatic  Skin: Warm and dry, no rashes. Cardiac: Regular rate and rhythm, no murmurs rubs or gallops, no lower extremity edema.  Respiratory: Clear to auscultation bilaterally. Not using accessory muscles, speaking in full sentences.   Impression and Recommendations:   HTN -  Well controlled. Continue current regimen. Follow up in  6 months.   COPD- STable. No recent flares. Continue with Spiriva and as needed albuterol.  CKD 3 - recheck kidney function.  insomnia - work on decreasing caffeine intake. She currently drinks 2-3 pots of coffee a day and drinks them throughout the day. Discussed cutting back  significantly especially if she is having sleep issues.  Declines mammogram. She said she's not interested in having that done at all over. Also declines pneumonia vaccine. Again says that she has felt sick when she's gotten in the past and does not want to have it done again.

## 2016-09-07 NOTE — Patient Instructions (Signed)
Please schedule your Medicare Wellness Exam this Spring

## 2016-09-12 ENCOUNTER — Other Ambulatory Visit: Payer: Self-pay | Admitting: Family Medicine
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
## 2016-09-13 ENCOUNTER — Telehealth (HOSPITAL_COMMUNITY): Payer: Self-pay | Admitting: *Deleted

## 2016-09-13 ENCOUNTER — Telehealth: Payer: Self-pay

## 2016-09-13 MED ORDER — MELOXICAM 15 MG PO TABS: 15.0000 mg | ORAL_TABLET | Freq: Every day | ORAL | 3 refills | Status: DC | PRN

## 2016-09-13 MED ORDER — ATORVASTATIN CALCIUM 40 MG PO TABS: 40.0000 mg | ORAL_TABLET | Freq: Every day | ORAL | 1 refills | Status: DC

## 2016-09-13 NOTE — Telephone Encounter (Signed)
Pharmacy called and asked for a refill on meloxicam and atorvastatin.  Please advise.

## 2016-09-13 NOTE — Telephone Encounter (Signed)
Meds refille.

## 2016-09-13 NOTE — Telephone Encounter (Signed)
Pt pharmacy called stating they would like fax number for Dr. Vanetta ShawlHisada office. Asked caller from pharmacy who the pt is and she verified pt. Per caller from pharmacy, she was calling for refills on pt Lamictal and Escitalopram. Informed caller that pt pt have f/u appt with provider tomorrow 09-14-2016 and per pt chart, pt was given 30 tablets with 0 refills on  08-17-2016. Called pt remind her of her appt tomorrow and she verbalized understanding, Informed pt that at her appt, she will get more refills due to pharmacy calling for more refills. Per pt she's been out her Depade for about 3 days not and she might not be in a good mood when she comes for her appt. Pt did not mention anything about being out of the medications the pharmacy was requesting only her Depade. Informed pt that she should not be out of her medication because provider gived her enough tablets to last her for 30 days and was given this medication on 08-17-2016 and she should be out tomorrow and that's when she'll get more refills. Asked pt if she is accidentally taking more tablets then required and per pt, she is not because she counts her tablets and she dispense them in a weekly pill box so she knows she's not taking more then is suppose to. Reminded pt again that her appt is tomorrow 09-14-2016 and at that time provider will give her more refills. Pt verbalized understanding about coming to appt.

## 2016-09-13 NOTE — Telephone Encounter (Signed)
Noted  

## 2016-09-14 ENCOUNTER — Ambulatory Visit (INDEPENDENT_AMBULATORY_CARE_PROVIDER_SITE_OTHER): Payer: Medicare Other | Admitting: Psychiatry

## 2016-09-14 ENCOUNTER — Encounter (HOSPITAL_COMMUNITY): Payer: Self-pay | Admitting: Psychiatry

## 2016-09-14 VITALS — BP 152/80 | HR 76 | Ht 65.0 in | Wt 114.8 lb

## 2016-09-14 DIAGNOSIS — F17210 Nicotine dependence, cigarettes, uncomplicated: Secondary | ICD-10-CM | POA: Diagnosis not present

## 2016-09-14 DIAGNOSIS — Z811 Family history of alcohol abuse and dependence: Secondary | ICD-10-CM

## 2016-09-14 DIAGNOSIS — Z79899 Other long term (current) drug therapy: Secondary | ICD-10-CM | POA: Diagnosis not present

## 2016-09-14 DIAGNOSIS — F332 Major depressive disorder, recurrent severe without psychotic features: Secondary | ICD-10-CM

## 2016-09-14 DIAGNOSIS — F1099 Alcohol use, unspecified with unspecified alcohol-induced disorder: Secondary | ICD-10-CM

## 2016-09-14 DIAGNOSIS — F1210 Cannabis abuse, uncomplicated: Secondary | ICD-10-CM

## 2016-09-14 DIAGNOSIS — Z818 Family history of other mental and behavioral disorders: Secondary | ICD-10-CM

## 2016-09-14 DIAGNOSIS — Z813 Family history of other psychoactive substance abuse and dependence: Secondary | ICD-10-CM | POA: Diagnosis not present

## 2016-09-14 DIAGNOSIS — Z888 Allergy status to other drugs, medicaments and biological substances status: Secondary | ICD-10-CM

## 2016-09-14 DIAGNOSIS — IMO0002 Reserved for concepts with insufficient information to code with codable children: Secondary | ICD-10-CM

## 2016-09-14 DIAGNOSIS — F331 Major depressive disorder, recurrent, moderate: Secondary | ICD-10-CM

## 2016-09-14 MED ORDER — LAMOTRIGINE 100 MG PO TABS: 100.0000 mg | ORAL_TABLET | Freq: Every day | ORAL | 0 refills | Status: DC

## 2016-09-14 MED ORDER — NALTREXONE HCL 50 MG PO TABS: 50.0000 mg | ORAL_TABLET | Freq: Every day | ORAL | 0 refills | Status: DC

## 2016-09-14 MED ORDER — MIRTAZAPINE 45 MG PO TABS: 45.0000 mg | ORAL_TABLET | Freq: Every day | ORAL | 0 refills | Status: DC

## 2016-09-14 MED ORDER — BUPROPION HCL ER (XL) 300 MG PO TB24: 300.0000 mg | ORAL_TABLET | Freq: Every day | ORAL | 0 refills | Status: DC

## 2016-09-14 MED ORDER — QUETIAPINE FUMARATE 25 MG PO TABS: 25.0000 mg | ORAL_TABLET | Freq: Every day | ORAL | 0 refills | Status: DC

## 2016-09-14 NOTE — Patient Instructions (Signed)
1. Continue quetiapine 25 mg at night 2.  Increase mirtazapine 45 mg at night 3. Continue Bupropion 300 mg daily 4. Decrease lamotrigine 100 mg daily 5. Continue naltrexone 50 mg daily 6. Return to clinic in 3 weeks
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
## 2016-10-05 ENCOUNTER — Encounter: Payer: Self-pay | Admitting: Family Medicine

## 2016-10-05 ENCOUNTER — Ambulatory Visit (INDEPENDENT_AMBULATORY_CARE_PROVIDER_SITE_OTHER): Payer: Medicare Other | Admitting: Family Medicine

## 2016-10-05 ENCOUNTER — Telehealth (HOSPITAL_COMMUNITY): Payer: Self-pay | Admitting: *Deleted

## 2016-10-05 ENCOUNTER — Ambulatory Visit (INDEPENDENT_AMBULATORY_CARE_PROVIDER_SITE_OTHER): Payer: Medicare Other | Admitting: Psychiatry

## 2016-10-05 ENCOUNTER — Encounter (HOSPITAL_COMMUNITY): Payer: Self-pay | Admitting: Psychiatry

## 2016-10-05 VITALS — BP 144/67 | HR 65 | Ht 65.0 in | Wt 116.6 lb

## 2016-10-05 VITALS — BP 139/61 | HR 73 | Ht 65.0 in | Wt 118.0 lb

## 2016-10-05 DIAGNOSIS — F1099 Alcohol use, unspecified with unspecified alcohol-induced disorder: Secondary | ICD-10-CM

## 2016-10-05 DIAGNOSIS — Z0000 Encounter for general adult medical examination without abnormal findings: Secondary | ICD-10-CM

## 2016-10-05 DIAGNOSIS — F17210 Nicotine dependence, cigarettes, uncomplicated: Secondary | ICD-10-CM | POA: Diagnosis not present

## 2016-10-05 DIAGNOSIS — Z79899 Other long term (current) drug therapy: Secondary | ICD-10-CM | POA: Diagnosis not present

## 2016-10-05 DIAGNOSIS — Z811 Family history of alcohol abuse and dependence: Secondary | ICD-10-CM

## 2016-10-05 DIAGNOSIS — Z813 Family history of other psychoactive substance abuse and dependence: Secondary | ICD-10-CM

## 2016-10-05 DIAGNOSIS — F1210 Cannabis abuse, uncomplicated: Secondary | ICD-10-CM | POA: Diagnosis not present

## 2016-10-05 DIAGNOSIS — Z818 Family history of other mental and behavioral disorders: Secondary | ICD-10-CM | POA: Diagnosis not present

## 2016-10-05 DIAGNOSIS — F331 Major depressive disorder, recurrent, moderate: Secondary | ICD-10-CM

## 2016-10-05 DIAGNOSIS — F1290 Cannabis use, unspecified, uncomplicated: Secondary | ICD-10-CM

## 2016-10-05 DIAGNOSIS — IMO0002 Reserved for concepts with insufficient information to code with codable children: Secondary | ICD-10-CM

## 2016-10-05 MED ORDER — MIRTAZAPINE 45 MG PO TABS: 45.0000 mg | ORAL_TABLET | Freq: Every day | ORAL | 0 refills | Status: DC

## 2016-10-05 MED ORDER — BUPROPION HCL ER (XL) 300 MG PO TB24: 300.0000 mg | ORAL_TABLET | Freq: Every day | ORAL | 0 refills | Status: DC

## 2016-10-05 MED ORDER — NALTREXONE HCL 50 MG PO TABS: 50.0000 mg | ORAL_TABLET | Freq: Every day | ORAL | 0 refills | Status: DC

## 2016-10-05 MED ORDER — QUETIAPINE FUMARATE 50 MG PO TABS: ORAL_TABLET | ORAL | 0 refills | Status: DC

## 2016-10-05 MED ORDER — LAMOTRIGINE 100 MG PO TABS: 100.0000 mg | ORAL_TABLET | Freq: Every day | ORAL | 0 refills | Status: DC

## 2016-10-05 NOTE — Patient Instructions (Addendum)
Keep up a regular exercise program and make sure you are eating a healthy diet Try to eat 4 servings of dairy a day, or if you are lactose intolerant take a calcium with vitamin D daily.  Your vaccines are up to date.     Vaccines to include Pneumoccal, Influenza, Hepatitis B, Td, Zostavax, HCV - declined.   Cardiovascular Disease  Colorectal cancer screening - declined.   Bone density screening - declined.   Diabetes screening -   Glaucoma screening - yearly eye exam is Up to date.   Mammography/PAP - declined   Nutrition counseling   Can start a stool softener once a day or a fiber supplement like benefiber.    Call if you would like to do physical therapy for your dizziness

## 2016-10-05 NOTE — Telephone Encounter (Signed)
voice message from pharmacy regarding Seroquel.    Please call pharmacy with instructions.

## 2016-10-05 NOTE — Patient Instructions (Signed)
1. Increase quetiapine 50 mg at night, 25 mg twice a day as needed for anxiety, irritability 2. Continue mirtazapine 45 mg at night 3. Continue Bupropion 300 mg daily 4. Continue lamotrigine 100 mg daily 5. Continue naltrexone 50 mg daily 6. Return to clinic in one month
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
## 2016-10-07 NOTE — Telephone Encounter (Signed)
Done

## 2016-10-07 NOTE — Telephone Encounter (Signed)
noted 
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
## 2016-11-01 ENCOUNTER — Ambulatory Visit (HOSPITAL_COMMUNITY): Payer: Self-pay | Admitting: Psychiatry

## 2016-11-02 ENCOUNTER — Ambulatory Visit (HOSPITAL_COMMUNITY): Payer: Self-pay | Admitting: Psychiatry
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
## 2016-11-09 ENCOUNTER — Encounter (HOSPITAL_COMMUNITY): Payer: Self-pay | Admitting: Psychiatry

## 2016-11-09 ENCOUNTER — Ambulatory Visit (INDEPENDENT_AMBULATORY_CARE_PROVIDER_SITE_OTHER): Payer: Medicare Other | Admitting: Psychiatry

## 2016-11-09 VITALS — BP 126/60 | HR 62 | Ht 65.0 in | Wt 119.2 lb

## 2016-11-09 DIAGNOSIS — F1490 Cocaine use, unspecified, uncomplicated: Secondary | ICD-10-CM | POA: Diagnosis not present

## 2016-11-09 DIAGNOSIS — Z818 Family history of other mental and behavioral disorders: Secondary | ICD-10-CM

## 2016-11-09 DIAGNOSIS — Z7951 Long term (current) use of inhaled steroids: Secondary | ICD-10-CM | POA: Diagnosis not present

## 2016-11-09 DIAGNOSIS — Z886 Allergy status to analgesic agent status: Secondary | ICD-10-CM

## 2016-11-09 DIAGNOSIS — Z791 Long term (current) use of non-steroidal anti-inflammatories (NSAID): Secondary | ICD-10-CM | POA: Diagnosis not present

## 2016-11-09 DIAGNOSIS — Z79899 Other long term (current) drug therapy: Secondary | ICD-10-CM

## 2016-11-09 DIAGNOSIS — Z841 Family history of disorders of kidney and ureter: Secondary | ICD-10-CM

## 2016-11-09 DIAGNOSIS — F329 Major depressive disorder, single episode, unspecified: Secondary | ICD-10-CM | POA: Diagnosis not present

## 2016-11-09 DIAGNOSIS — F419 Anxiety disorder, unspecified: Secondary | ICD-10-CM | POA: Diagnosis not present

## 2016-11-09 DIAGNOSIS — F1099 Alcohol use, unspecified with unspecified alcohol-induced disorder: Secondary | ICD-10-CM

## 2016-11-09 DIAGNOSIS — IMO0002 Reserved for concepts with insufficient information to code with codable children: Secondary | ICD-10-CM

## 2016-11-09 DIAGNOSIS — F331 Major depressive disorder, recurrent, moderate: Secondary | ICD-10-CM

## 2016-11-09 DIAGNOSIS — Z801 Family history of malignant neoplasm of trachea, bronchus and lung: Secondary | ICD-10-CM

## 2016-11-09 DIAGNOSIS — F1210 Cannabis abuse, uncomplicated: Secondary | ICD-10-CM

## 2016-11-09 DIAGNOSIS — Z823 Family history of stroke: Secondary | ICD-10-CM

## 2016-11-09 DIAGNOSIS — I10 Essential (primary) hypertension: Secondary | ICD-10-CM | POA: Diagnosis not present

## 2016-11-09 DIAGNOSIS — Z811 Family history of alcohol abuse and dependence: Secondary | ICD-10-CM

## 2016-11-09 DIAGNOSIS — J449 Chronic obstructive pulmonary disease, unspecified: Secondary | ICD-10-CM | POA: Diagnosis not present

## 2016-11-09 DIAGNOSIS — F1290 Cannabis use, unspecified, uncomplicated: Secondary | ICD-10-CM | POA: Diagnosis not present

## 2016-11-09 DIAGNOSIS — F17210 Nicotine dependence, cigarettes, uncomplicated: Secondary | ICD-10-CM

## 2016-11-09 DIAGNOSIS — Z888 Allergy status to other drugs, medicaments and biological substances status: Secondary | ICD-10-CM

## 2016-11-09 DIAGNOSIS — Z813 Family history of other psychoactive substance abuse and dependence: Secondary | ICD-10-CM

## 2016-11-09 MED ORDER — QUETIAPINE FUMARATE 50 MG PO TABS: 50.0000 mg | ORAL_TABLET | Freq: Two times a day (BID) | ORAL | 1 refills | Status: DC

## 2016-11-09 MED ORDER — LAMOTRIGINE 100 MG PO TABS: 100.0000 mg | ORAL_TABLET | Freq: Every day | ORAL | 1 refills | Status: DC

## 2016-11-09 MED ORDER — BUPROPION HCL ER (XL) 300 MG PO TB24: 300.0000 mg | ORAL_TABLET | Freq: Every day | ORAL | 1 refills | Status: DC

## 2016-11-09 MED ORDER — MIRTAZAPINE 45 MG PO TABS: 45.0000 mg | ORAL_TABLET | Freq: Every day | ORAL | 1 refills | Status: DC

## 2016-11-09 MED ORDER — NALTREXONE HCL 50 MG PO TABS: 50.0000 mg | ORAL_TABLET | Freq: Every day | ORAL | 1 refills | Status: DC

## 2016-11-09 NOTE — Patient Instructions (Addendum)
1. Continue quetiapine 50 mg mg twice a day 2. Continue mirtazapine 45 mg at night 3. Continue Bupropion 300 mg daily 4. Continue lamotrigine 100 mg daily 5. Continue naltrexone 50 mg daily 6. Return to clinic in one month

## 2016-11-23 ENCOUNTER — Ambulatory Visit (INDEPENDENT_AMBULATORY_CARE_PROVIDER_SITE_OTHER): Payer: Medicare Other | Admitting: Family Medicine

## 2016-11-23 ENCOUNTER — Ambulatory Visit (INDEPENDENT_AMBULATORY_CARE_PROVIDER_SITE_OTHER): Payer: Medicare Other

## 2016-11-23 ENCOUNTER — Encounter: Payer: Self-pay | Admitting: Family Medicine

## 2016-11-23 VITALS — BP 125/63 | HR 78 | Ht 64.17 in | Wt 122.0 lb

## 2016-11-23 DIAGNOSIS — M8588 Other specified disorders of bone density and structure, other site: Secondary | ICD-10-CM | POA: Diagnosis not present

## 2016-11-23 DIAGNOSIS — M7062 Trochanteric bursitis, left hip: Secondary | ICD-10-CM

## 2016-11-23 DIAGNOSIS — Z0000 Encounter for general adult medical examination without abnormal findings: Secondary | ICD-10-CM | POA: Diagnosis not present

## 2016-11-23 DIAGNOSIS — M25552 Pain in left hip: Secondary | ICD-10-CM | POA: Diagnosis not present

## 2016-11-23 DIAGNOSIS — S79912A Unspecified injury of left hip, initial encounter: Secondary | ICD-10-CM | POA: Diagnosis not present

## 2016-11-23 MED ORDER — CELECOXIB 200 MG PO CAPS: 200.0000 mg | ORAL_CAPSULE | Freq: Two times a day (BID) | ORAL | 0 refills | Status: DC | PRN
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
## 2016-11-23 NOTE — Patient Instructions (Addendum)
Hip Bursitis Hip bursitis is inflammation of a fluid-filled sac (bursa) in the hip joint. The bursa protects the bones in the hip joint from rubbing against each other. Hip bursitis can cause mild to moderate pain, and symptoms often come and go over time. What are the causes? This condition may be caused by:  Injury to the hip.  Overuse of the muscles that surround the hip joint.  Arthritis or gout.  Diabetes.  Thyroid disease.  Cold weather.  Infection.  In some cases, the cause may not be known. What are the signs or symptoms? Symptoms of this condition may include:  Mild or moderate pain in the hip area. Pain may get worse with movement.  Tenderness and swelling of the hip, especially on the outer side of the hip.  Symptoms may come and go. If the bursa becomes infected, you may have the following symptoms:  Fever.  Red skin and a feeling of warmth in the hip area.  How is this diagnosed? This condition may be diagnosed based on:  A physical exam.  Your medical history.  X-rays.  Removal of fluid from your inflamed bursa for testing (biopsy).  You may be sent to a health care provider who specializes in bone diseases (orthopedist) or a provider who specializes in joint inflammation (rheumatologist). How is this treated? This condition is treated by resting, raising (elevating), and applying pressure(compression) to the injured area. In some cases, this may be enough to make your symptoms go away. Treatment may also include:  Crutches.  Antibiotic medicine.  Draining fluid out of the bursa to help relieve swelling.  Injecting medicine that helps to reduce inflammation (cortisone).  Follow these instructions at home: Medicines  Take over-the-counter and prescription medicines only as told by your health care provider.  Do not drive or operate heavy machinery while taking prescription pain medicine, or as told by your health care provider.  If you were  prescribed an antibiotic, take it as told by your health care provider. Do not stop taking the antibiotic even if you start to feel better. Activity  Return to your normal activities as told by your health care provider. Ask your health care provider what activities are safe for you.  Rest and protect your hip as much as possible until your pain and swelling get better. General instructions  Wear compression wraps only as told by your health care provider.  Elevate your hip above the level of your heart as much as you can without pain. To do this, try putting a pillow under your hips while you lie down.  Do not use your hip to support your body weight until your health care provider says that you can. Use crutches as told by your health care provider.  Gently massage and stretch your injured area as often as is comfortable.  Keep all follow-up visits as told by your health care provider. This is important. How is this prevented?  Exercise regularly, as told by your health care provider.  Warm up and stretch before being active.  Cool down and stretch after being active.  If an activity irritates your hip or causes pain, avoid the activity as much as possible.  Avoid sitting down for long periods at a time. Contact a health care provider if:  You have a fever.  You develop new symptoms.  You have difficulty walking or doing everyday activities.  You have pain that gets worse or does not get better with medicine.  You   develop red skin or a feeling of warmth in your hip area. Get help right away if:  You cannot move your hip.  You have severe pain. This information is not intended to replace advice given to you by your health care provider. Make sure you discuss any questions you have with your health care provider. Document Released: 11/26/2001 Document Revised: 11/12/2015 Document Reviewed: 01/06/2015 Elsevier Interactive Patient Education  2018 Elsevier Inc.  

## 2016-11-25 ENCOUNTER — Telehealth (HOSPITAL_COMMUNITY): Payer: Self-pay | Admitting: *Deleted

## 2016-11-25 NOTE — Telephone Encounter (Signed)
[facility].   She said she has POA of patient, however there is no document in system.   A phone call was made to patient, but she did not answer phone.  left voice message for her to call to confirm information.
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
## 2016-12-07 ENCOUNTER — Ambulatory Visit (HOSPITAL_COMMUNITY): Payer: Self-pay | Admitting: Psychiatry

## 2016-12-08 ENCOUNTER — Ambulatory Visit (INDEPENDENT_AMBULATORY_CARE_PROVIDER_SITE_OTHER): Payer: Medicare Other | Admitting: Psychiatry

## 2016-12-08 ENCOUNTER — Telehealth (HOSPITAL_COMMUNITY): Payer: Self-pay | Admitting: *Deleted

## 2016-12-08 ENCOUNTER — Encounter (HOSPITAL_COMMUNITY): Payer: Self-pay | Admitting: Psychiatry

## 2016-12-08 DIAGNOSIS — Z811 Family history of alcohol abuse and dependence: Secondary | ICD-10-CM

## 2016-12-08 DIAGNOSIS — F17210 Nicotine dependence, cigarettes, uncomplicated: Secondary | ICD-10-CM | POA: Diagnosis not present

## 2016-12-08 DIAGNOSIS — F1290 Cannabis use, unspecified, uncomplicated: Secondary | ICD-10-CM | POA: Diagnosis not present

## 2016-12-08 DIAGNOSIS — Z818 Family history of other mental and behavioral disorders: Secondary | ICD-10-CM

## 2016-12-08 DIAGNOSIS — I10 Essential (primary) hypertension: Secondary | ICD-10-CM | POA: Diagnosis not present

## 2016-12-08 DIAGNOSIS — Z813 Family history of other psychoactive substance abuse and dependence: Secondary | ICD-10-CM | POA: Diagnosis not present

## 2016-12-08 DIAGNOSIS — F1099 Alcohol use, unspecified with unspecified alcohol-induced disorder: Secondary | ICD-10-CM | POA: Diagnosis not present

## 2016-12-08 DIAGNOSIS — F331 Major depressive disorder, recurrent, moderate: Secondary | ICD-10-CM

## 2016-12-08 DIAGNOSIS — J449 Chronic obstructive pulmonary disease, unspecified: Secondary | ICD-10-CM | POA: Diagnosis not present

## 2016-12-08 DIAGNOSIS — F1490 Cocaine use, unspecified, uncomplicated: Secondary | ICD-10-CM

## 2016-12-08 MED ORDER — NALTREXONE HCL 50 MG PO TABS: 50.0000 mg | ORAL_TABLET | Freq: Every day | ORAL | 1 refills | Status: DC

## 2016-12-08 MED ORDER — BUPROPION HCL ER (XL) 300 MG PO TB24: 300.0000 mg | ORAL_TABLET | Freq: Every day | ORAL | 1 refills | Status: DC

## 2016-12-08 MED ORDER — QUETIAPINE FUMARATE 50 MG PO TABS: 50.0000 mg | ORAL_TABLET | Freq: Two times a day (BID) | ORAL | 1 refills | Status: DC

## 2016-12-08 MED ORDER — MIRTAZAPINE 45 MG PO TABS: 45.0000 mg | ORAL_TABLET | Freq: Every day | ORAL | 1 refills | Status: DC

## 2016-12-08 MED ORDER — LAMOTRIGINE 100 MG PO TABS: 100.0000 mg | ORAL_TABLET | Freq: Every day | ORAL | 1 refills | Status: DC

## 2016-12-08 NOTE — Telephone Encounter (Signed)
noted 

## 2016-12-08 NOTE — Telephone Encounter (Signed)
patient's daughter did not schedule an appointment with Josh.  She said they can not make two visits a month.   She said it is over an hour drive here.

## 2016-12-08 NOTE — Patient Instructions (Signed)
1. Continue quetiapine 50 mg twice a day 2. Continue mirtazapine 45 mg at night 3. Continue Bupropion 300 mg daily 4. Continue lamotrigine 100 mg daily 5. Continue naltrexone 50 mg daily 6. Return to clinic in one month

## 2016-12-08 NOTE — Telephone Encounter (Signed)
Noted  

## 2016-12-12 ENCOUNTER — Other Ambulatory Visit (HOSPITAL_COMMUNITY): Payer: Self-pay | Admitting: Psychiatry

## 2016-12-12 ENCOUNTER — Telehealth (HOSPITAL_COMMUNITY): Payer: Self-pay | Admitting: *Deleted

## 2016-12-12 MED ORDER — ESCITALOPRAM OXALATE 20 MG PO TABS: 20.0000 mg | ORAL_TABLET | Freq: Every day | ORAL | 0 refills | Status: DC

## 2016-12-12 NOTE — Telephone Encounter (Signed)
Discussed with a pharmacist. Patient has been filled lexapro 20 mg daily at least over the past two months (although patient was inconsistent about taking this medication). Ordered 20 mg daily for 30 days with no refill.

## 2016-12-12 NOTE — Telephone Encounter (Signed)
phone call from patient, said she need a refill of her Lexapro.  she said the pharmacy wants to know if Lexapro can be increased to 20 mg. so it is only one pill a day.

## 2016-12-13 NOTE — Telephone Encounter (Signed)
noted 

## 2016-12-26 ENCOUNTER — Ambulatory Visit (INDEPENDENT_AMBULATORY_CARE_PROVIDER_SITE_OTHER): Payer: Medicare Other | Admitting: Sports Medicine

## 2016-12-26 ENCOUNTER — Encounter: Payer: Self-pay | Admitting: Sports Medicine

## 2016-12-26 DIAGNOSIS — M7062 Trochanteric bursitis, left hip: Secondary | ICD-10-CM | POA: Diagnosis not present

## 2016-12-26 NOTE — Progress Notes (Signed)
   Subjective:    I'm seeing this patient as a consultation for:  Dr. Nani Gasseratherine Metheney  CC: Left hip pain  HPI: For over a month this 66 year old female has had severe pain she localizes on the lateral aspect of her left hip, she does have a history of right trochanteric bursitis that responded well to a guided injection. She was given an injection last month but did not note much improvement so she is here for further evaluation and definitive treatment, pain is severe, persistent, localized over the greater trochanter without radiation, no groin pain.  Past medical history:  Negative.  See flowsheet/record as well for more information.  Surgical history: Negative.  See flowsheet/record as well for more information.  Family history: Negative.  See flowsheet/record as well for more information.  Social history: Negative.  See flowsheet/record as well for more information.  Allergies, and medications have been entered into the medical record, reviewed, and no changes needed.   Review of Systems: No headache, visual changes, nausea, vomiting, diarrhea, constipation, dizziness, abdominal pain, skin rash, fevers, chills, night sweats, weight loss, swollen lymph nodes, body aches, joint swelling, muscle aches, chest pain, shortness of breath, mood changes, visual or auditory hallucinations.   Objective:   General: Well Developed, well nourished, and in no acute distress.  Neuro/Psych: Alert and oriented x3, extra-ocular muscles intact, able to move all 4 extremities, sensation grossly intact. Skin: Warm and dry, no rashes noted.  Respiratory: Not using accessory muscles, speaking in full sentences, trachea midline.  Cardiovascular: Pulses palpable, no extremity edema. Abdomen: Does not appear distended. Left Hip: ROM IR: 60 Deg, ER: 60 Deg, Flexion: 120 Deg, Extension: 100 Deg, Abduction: 45 Deg, Adduction: 45 Deg Strength IR: 5/5, ER: 5/5, Flexion: 5/5, Extension: 5/5, Abduction: 5/5,  Adduction: 5/5 Pelvic alignment unremarkable to inspection and palpation. Standing hip rotation and gait without trendelenburg / unsteadiness. Greater trochanter with severe tenderness to palpation. No tenderness over piriformis. No SI joint tenderness and normal minimal SI movement.  Procedure: Real-time Ultrasound Guided Injection of left greater trochanteric bursa Device: GE Logiq E  Verbal informed consent obtained.  Time-out conducted.  Noted no overlying erythema, induration, or other signs of local infection.  Skin prepped in a sterile fashion.  Local anesthesia: Topical Ethyl chloride.  With sterile technique and under real time ultrasound guidance:  Using a spinal needle I placed some medication superficial to the gluteus medius insertion, I then advanced through the gluteus medius to the greater trochanter, contacted bone and then injected the rest of the medication for a total of 1 mL kenalog 40, 2 mL lidocaine, 2 mL bupivacaine. Completed without difficulty  Pain immediately resolved suggesting accurate placement of the medication.  Advised to call if fevers/chills, erythema, induration, drainage, or persistent bleeding.  Images permanently stored and available for review in the ultrasound unit.  Impression: Technically successful ultrasound guided injection.  Impression and Recommendations:   This case required medical decision making of moderate complexity.  Trochanteric bursitis, left hip Only temporary relief from the injection last month, repeat injection today this time with ultrasound guidance. Aggressive formal physical therapy. If insufficient relief over the next month we will proceed with a hip MRI.

## 2016-12-26 NOTE — Assessment & Plan Note (Signed)
Only temporary relief from the injection last month, repeat injection today this time with ultrasound guidance. Aggressive formal physical therapy. If insufficient relief over the next month we will proceed with a hip MRI.

## 2017-01-02 ENCOUNTER — Other Ambulatory Visit: Payer: Self-pay | Admitting: Sports Medicine

## 2017-01-02 DIAGNOSIS — M7062 Trochanteric bursitis, left hip: Secondary | ICD-10-CM
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
## 2017-01-06 ENCOUNTER — Encounter (HOSPITAL_COMMUNITY): Payer: Self-pay | Admitting: Psychiatry

## 2017-01-06 ENCOUNTER — Ambulatory Visit (INDEPENDENT_AMBULATORY_CARE_PROVIDER_SITE_OTHER): Payer: Medicare Other | Admitting: Psychiatry

## 2017-01-06 VITALS — BP 145/70 | HR 72 | Ht 64.17 in | Wt 119.4 lb

## 2017-01-06 DIAGNOSIS — Z813 Family history of other psychoactive substance abuse and dependence: Secondary | ICD-10-CM

## 2017-01-06 DIAGNOSIS — Z818 Family history of other mental and behavioral disorders: Secondary | ICD-10-CM | POA: Diagnosis not present

## 2017-01-06 DIAGNOSIS — F17210 Nicotine dependence, cigarettes, uncomplicated: Secondary | ICD-10-CM

## 2017-01-06 DIAGNOSIS — F1199 Opioid use, unspecified with unspecified opioid-induced disorder: Secondary | ICD-10-CM

## 2017-01-06 DIAGNOSIS — Z811 Family history of alcohol abuse and dependence: Secondary | ICD-10-CM

## 2017-01-06 DIAGNOSIS — F331 Major depressive disorder, recurrent, moderate: Secondary | ICD-10-CM | POA: Diagnosis not present

## 2017-01-06 DIAGNOSIS — F1290 Cannabis use, unspecified, uncomplicated: Secondary | ICD-10-CM

## 2017-01-06 MED ORDER — MIRTAZAPINE 45 MG PO TABS: 45.0000 mg | ORAL_TABLET | Freq: Every day | ORAL | 1 refills | Status: DC

## 2017-01-06 MED ORDER — LAMOTRIGINE 100 MG PO TABS: 100.0000 mg | ORAL_TABLET | Freq: Every day | ORAL | 1 refills | Status: DC

## 2017-01-06 MED ORDER — BUPROPION HCL ER (XL) 300 MG PO TB24: 300.0000 mg | ORAL_TABLET | Freq: Every day | ORAL | 1 refills | Status: DC

## 2017-01-06 MED ORDER — QUETIAPINE FUMARATE 50 MG PO TABS: 50.0000 mg | ORAL_TABLET | Freq: Two times a day (BID) | ORAL | 1 refills | Status: DC

## 2017-01-06 MED ORDER — ESCITALOPRAM OXALATE 20 MG PO TABS: 20.0000 mg | ORAL_TABLET | Freq: Every day | ORAL | 1 refills | Status: DC

## 2017-01-06 NOTE — Patient Instructions (Signed)
1. Continue escitalopram 20  mg daily 2. Consider decreasing it to quetiapine 25 mg twice a day to avoid dizziness 3. Continue mirtazapine 45 mg at night 4. Continue lamotrigine 100 mg daily 5. Continue naltrexone 50 mg daily 6. Return to clinic in one month 7. Continue cutting down marijuana use

## 2017-01-20 ENCOUNTER — Other Ambulatory Visit: Payer: Self-pay | Admitting: Family Medicine

## 2017-01-23 ENCOUNTER — Telehealth: Payer: Self-pay

## 2017-01-23 NOTE — Telephone Encounter (Signed)
I agree needs appt.  May be having COPD exacerbation.

## 2017-01-23 NOTE — Telephone Encounter (Signed)
Ladean RayaConstance called stated that she has made housecalls to the patient and she has bronchi to all the lobes with wheezing. She stated that patient is using her inhalers but she wanted to make PCP aware. Advised her patient needed to come in the office for appointment if no better but she just wanted to advise PCP. Kiernan Farkas,CMA

## 2017-01-24 NOTE — Telephone Encounter (Signed)
Called pt and scheduled appt for Thursday @ 1110 .Loralee [place], [place]quita Ron[place]

## 2017-01-26 ENCOUNTER — Ambulatory Visit (INDEPENDENT_AMBULATORY_CARE_PROVIDER_SITE_OTHER): Payer: Medicare Other | Admitting: Sports Medicine

## 2017-01-26 ENCOUNTER — Ambulatory Visit: Payer: Self-pay | Admitting: Family Medicine

## 2017-01-26 ENCOUNTER — Encounter: Payer: Self-pay | Admitting: Sports Medicine

## 2017-01-26 DIAGNOSIS — M7062 Trochanteric bursitis, left hip: Secondary | ICD-10-CM | POA: Diagnosis not present

## 2017-01-26 NOTE — Assessment & Plan Note (Signed)
Resolved after injection and formal PT.

## 2017-01-26 NOTE — Progress Notes (Signed)
  Subjective:    CC: Follow-up  HPI: Left trochanteric bursitis: Resolved after injection, and home health physical therapy.  Past medical history:  Negative.  See flowsheet/record as well for more information.  Surgical history: Negative.  See flowsheet/record as well for more information.  Family history: Negative.  See flowsheet/record as well for more information.  Social history: Negative.  See flowsheet/record as well for more information.  Allergies, and medications have been entered into the medical record, reviewed, and no changes needed.   Review of Systems: No fevers, chills, night sweats, weight loss, chest pain, or shortness of breath.   Objective:    General: Well Developed, well nourished, and in no acute distress.  Neuro: Alert and oriented x3, extra-ocular muscles intact, sensation grossly intact.  HEENT: Normocephalic, atraumatic, pupils equal round reactive to light, neck supple, no masses, no lymphadenopathy, thyroid nonpalpable.  Skin: Warm and dry, no rashes. Cardiac: Regular rate and rhythm, no murmurs rubs or gallops, no lower extremity edema.  Respiratory: Clear to auscultation bilaterally. Not using accessory muscles, speaking in full sentences. Left Hip: ROM IR: 60 Deg, ER: 60 Deg, Flexion: 120 Deg, Extension: 100 Deg, Abduction: 45 Deg, Adduction: 45 Deg Strength IR: 5/5, ER: 5/5, Flexion: 5/5, Extension: 5/5, Abduction: 5/5, Adduction: 5/5 Pelvic alignment unremarkable to inspection and palpation. Standing hip rotation and gait without trendelenburg / unsteadiness. Greater trochanter without tenderness to palpation. No tenderness over piriformis. No SI joint tenderness and normal minimal SI movement.  Impression and Recommendations:    Trochanteric bursitis, left hip Resolved after injection and formal PT.

## 2017-01-30 ENCOUNTER — Other Ambulatory Visit: Payer: Self-pay | Admitting: Family Medicine

## 2017-01-30 NOTE — Telephone Encounter (Signed)
We just filled Celebrex about a week ago. She cannot mix NSAIDs.

## 2017-01-30 NOTE — Telephone Encounter (Signed)
This is a historical med.

## 2017-01-31 ENCOUNTER — Other Ambulatory Visit: Payer: Self-pay | Admitting: Family Medicine
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
## 2017-02-02 ENCOUNTER — Encounter (HOSPITAL_COMMUNITY): Payer: Self-pay | Admitting: Psychiatry

## 2017-02-02 ENCOUNTER — Ambulatory Visit (INDEPENDENT_AMBULATORY_CARE_PROVIDER_SITE_OTHER): Payer: Medicare Other | Admitting: Psychiatry

## 2017-02-02 VITALS — BP 158/86 | HR 68 | Ht 64.17 in | Wt 121.4 lb

## 2017-02-02 DIAGNOSIS — F17210 Nicotine dependence, cigarettes, uncomplicated: Secondary | ICD-10-CM | POA: Diagnosis not present

## 2017-02-02 DIAGNOSIS — F1099 Alcohol use, unspecified with unspecified alcohol-induced disorder: Secondary | ICD-10-CM

## 2017-02-02 DIAGNOSIS — F1210 Cannabis abuse, uncomplicated: Secondary | ICD-10-CM | POA: Diagnosis not present

## 2017-02-02 DIAGNOSIS — Z813 Family history of other psychoactive substance abuse and dependence: Secondary | ICD-10-CM

## 2017-02-02 DIAGNOSIS — Z811 Family history of alcohol abuse and dependence: Secondary | ICD-10-CM

## 2017-02-02 DIAGNOSIS — F419 Anxiety disorder, unspecified: Secondary | ICD-10-CM

## 2017-02-02 DIAGNOSIS — Z818 Family history of other mental and behavioral disorders: Secondary | ICD-10-CM

## 2017-02-02 DIAGNOSIS — G4700 Insomnia, unspecified: Secondary | ICD-10-CM | POA: Diagnosis not present

## 2017-02-02 DIAGNOSIS — F331 Major depressive disorder, recurrent, moderate: Secondary | ICD-10-CM

## 2017-02-02 MED ORDER — NALTREXONE HCL 50 MG PO TABS: 50.0000 mg | ORAL_TABLET | Freq: Every day | ORAL | 1 refills | Status: DC

## 2017-02-02 MED ORDER — LAMOTRIGINE 150 MG PO TABS: 150.0000 mg | ORAL_TABLET | Freq: Every day | ORAL | 1 refills | Status: DC

## 2017-02-02 MED ORDER — QUETIAPINE FUMARATE 50 MG PO TABS: 50.0000 mg | ORAL_TABLET | Freq: Two times a day (BID) | ORAL | 1 refills | Status: DC

## 2017-02-02 MED ORDER — ESCITALOPRAM OXALATE 20 MG PO TABS: 20.0000 mg | ORAL_TABLET | Freq: Every day | ORAL | 1 refills | Status: DC

## 2017-02-02 MED ORDER — MIRTAZAPINE 45 MG PO TABS: 45.0000 mg | ORAL_TABLET | Freq: Every day | ORAL | 1 refills | Status: DC

## 2017-02-02 MED ORDER — BUPROPION HCL ER (XL) 300 MG PO TB24: 300.0000 mg | ORAL_TABLET | Freq: Every day | ORAL | 1 refills | Status: DC

## 2017-02-02 NOTE — Patient Instructions (Addendum)
1. Continue escitalopram 20 mg daily 2. Continue wellbutrin 300 mg daily 3. Continue mirtazapine 45 mg at night 4. Increase lamotrigine 150 mg daily 5. Continue naltrexone 50 mg daily  6. Return to clinic in one month for 30 mins 7. Please write done good things you did every day

## 2017-02-21 ENCOUNTER — Telehealth (HOSPITAL_COMMUNITY): Payer: Self-pay | Admitting: *Deleted

## 2017-02-21 ENCOUNTER — Other Ambulatory Visit: Payer: Self-pay | Admitting: Family Medicine

## 2017-02-21 NOTE — Telephone Encounter (Signed)
Office received refill request from pt pharmacy [facility] for pt Meloxicam 15 mg QD PRN which is a pain medication. Staff called pt  To inform her that provider only prescribe psychiatric medications and not pain medications. Per pt ph okay she will call her PCP to see if they can fill her Maloxicam. Informed pt message will be sent to provider to inform her as well and pt verbally agreed.

## 2017-02-22 NOTE — Telephone Encounter (Signed)
noted 
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
## 2017-03-01 ENCOUNTER — Ambulatory Visit (HOSPITAL_COMMUNITY): Payer: Medicare Other | Admitting: Psychiatry

## 2017-03-03 ENCOUNTER — Ambulatory Visit (HOSPITAL_COMMUNITY): Payer: Self-pay | Admitting: Psychiatry

## 2017-03-09 ENCOUNTER — Ambulatory Visit (INDEPENDENT_AMBULATORY_CARE_PROVIDER_SITE_OTHER): Payer: Medicare Other | Admitting: Family Medicine

## 2017-03-09 ENCOUNTER — Encounter: Payer: Self-pay | Admitting: Family Medicine

## 2017-03-09 ENCOUNTER — Ambulatory Visit (INDEPENDENT_AMBULATORY_CARE_PROVIDER_SITE_OTHER): Payer: Medicare Other

## 2017-03-09 VITALS — BP 127/67 | HR 81 | Ht 64.0 in | Wt 120.0 lb

## 2017-03-09 DIAGNOSIS — S61209A Unspecified open wound of unspecified finger without damage to nail, initial encounter: Secondary | ICD-10-CM

## 2017-03-09 DIAGNOSIS — M19042 Primary osteoarthritis, left hand: Secondary | ICD-10-CM

## 2017-03-09 DIAGNOSIS — M19041 Primary osteoarthritis, right hand: Secondary | ICD-10-CM

## 2017-03-09 DIAGNOSIS — F331 Major depressive disorder, recurrent, moderate: Secondary | ICD-10-CM | POA: Diagnosis not present

## 2017-03-09 DIAGNOSIS — J42 Unspecified chronic bronchitis: Secondary | ICD-10-CM

## 2017-03-09 DIAGNOSIS — I10 Essential (primary) hypertension: Secondary | ICD-10-CM

## 2017-03-09 LAB — BASIC METABOLIC PANEL WITH GFR
BUN: 11 mg/dL (ref 7–25)
CHLORIDE: 98 mmol/L (ref 98–110)
CO2: 24 mmol/L (ref 20–32)
Calcium: 9.5 mg/dL (ref 8.6–10.4)
Creat: 0.98 mg/dL (ref 0.50–0.99)
GFR, EST AFRICAN AMERICAN: 70 mL/min/{1.73_m2} (ref >=60)
GFR, Est Non African American: 61 mL/min/{1.73_m2} (ref >=60)
Glucose, Bld: 97 mg/dL (ref 65–99)
Potassium: 4.4 mmol/L (ref 3.5–5.3)
Sodium: 132 mmol/L — ABNORMAL LOW (ref 135–146)

## 2017-03-09 MED ORDER — DOXYCYCLINE HYCLATE 100 MG PO TABS: 100.0000 mg | ORAL_TABLET | Freq: Two times a day (BID) | ORAL | 0 refills | Status: DC
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
## 2017-03-10 ENCOUNTER — Ambulatory Visit: Payer: Self-pay | Admitting: Family Medicine

## 2017-03-13 ENCOUNTER — Other Ambulatory Visit: Payer: Self-pay | Admitting: Family Medicine

## 2017-03-13 MED ORDER — MUPIROCIN 2 % EX OINT: TOPICAL_OINTMENT | Freq: Two times a day (BID) | CUTANEOUS | 0 refills | Status: DC

## 2017-03-13 NOTE — Progress Notes (Unsigned)
mup 

## 2017-03-14 ENCOUNTER — Other Ambulatory Visit: Payer: Self-pay | Admitting: Sports Medicine

## 2017-03-14 ENCOUNTER — Other Ambulatory Visit: Payer: Self-pay | Admitting: Family Medicine

## 2017-03-15 ENCOUNTER — Ambulatory Visit (INDEPENDENT_AMBULATORY_CARE_PROVIDER_SITE_OTHER): Payer: Medicare Other | Admitting: Psychiatry

## 2017-03-15 ENCOUNTER — Encounter (HOSPITAL_COMMUNITY): Payer: Self-pay | Admitting: Psychiatry

## 2017-03-15 VITALS — BP 131/62 | HR 67 | Ht 64.0 in | Wt 119.8 lb

## 2017-03-15 DIAGNOSIS — R450 Nervousness: Secondary | ICD-10-CM | POA: Diagnosis not present

## 2017-03-15 DIAGNOSIS — J449 Chronic obstructive pulmonary disease, unspecified: Secondary | ICD-10-CM

## 2017-03-15 DIAGNOSIS — F1290 Cannabis use, unspecified, uncomplicated: Secondary | ICD-10-CM

## 2017-03-15 DIAGNOSIS — R454 Irritability and anger: Secondary | ICD-10-CM

## 2017-03-15 DIAGNOSIS — F1020 Alcohol dependence, uncomplicated: Secondary | ICD-10-CM | POA: Diagnosis not present

## 2017-03-15 DIAGNOSIS — F17210 Nicotine dependence, cigarettes, uncomplicated: Secondary | ICD-10-CM

## 2017-03-15 DIAGNOSIS — G4700 Insomnia, unspecified: Secondary | ICD-10-CM

## 2017-03-15 DIAGNOSIS — Z813 Family history of other psychoactive substance abuse and dependence: Secondary | ICD-10-CM | POA: Diagnosis not present

## 2017-03-15 DIAGNOSIS — F1220 Cannabis dependence, uncomplicated: Secondary | ICD-10-CM | POA: Diagnosis not present

## 2017-03-15 DIAGNOSIS — F331 Major depressive disorder, recurrent, moderate: Secondary | ICD-10-CM

## 2017-03-15 DIAGNOSIS — Z818 Family history of other mental and behavioral disorders: Secondary | ICD-10-CM

## 2017-03-15 DIAGNOSIS — Z634 Disappearance and death of family member: Secondary | ICD-10-CM | POA: Diagnosis not present

## 2017-03-15 DIAGNOSIS — Z811 Family history of alcohol abuse and dependence: Secondary | ICD-10-CM | POA: Diagnosis not present

## 2017-03-15 DIAGNOSIS — I10 Essential (primary) hypertension: Secondary | ICD-10-CM

## 2017-03-15 DIAGNOSIS — F419 Anxiety disorder, unspecified: Secondary | ICD-10-CM

## 2017-03-15 MED ORDER — QUETIAPINE FUMARATE 50 MG PO TABS: 50.0000 mg | ORAL_TABLET | Freq: Two times a day (BID) | ORAL | 2 refills | Status: DC

## 2017-03-15 MED ORDER — BUPROPION HCL ER (XL) 300 MG PO TB24: 300.0000 mg | ORAL_TABLET | Freq: Every day | ORAL | 2 refills | Status: DC

## 2017-03-15 MED ORDER — MIRTAZAPINE 45 MG PO TABS: 45.0000 mg | ORAL_TABLET | Freq: Every day | ORAL | 2 refills | Status: DC

## 2017-03-15 MED ORDER — LAMOTRIGINE 150 MG PO TABS: 150.0000 mg | ORAL_TABLET | Freq: Every day | ORAL | 2 refills | Status: DC

## 2017-03-15 MED ORDER — ESCITALOPRAM OXALATE 20 MG PO TABS: 20.0000 mg | ORAL_TABLET | Freq: Every day | ORAL | 2 refills | Status: DC

## 2017-03-15 NOTE — Patient Instructions (Addendum)
1. Continue escitalopram 20 mg daily 2. Continue Wellbutrin 300 mg daily 3. Continue mirtazapine 45 mg at night 4. Continue lamotrigine 150 mg daily 5. Continue naltrexone 50 mg daily 6. Continue quetiapine 50 mg twice day 6. Return to clinic in three months for 30 mins

## 2017-03-25 ENCOUNTER — Telehealth (HOSPITAL_COMMUNITY): Payer: Self-pay | Admitting: *Deleted

## 2017-03-25 NOTE — Telephone Encounter (Signed)
Opened in Error.

## 2017-03-27 ENCOUNTER — Other Ambulatory Visit: Payer: Self-pay | Admitting: Family Medicine

## 2017-04-24 ENCOUNTER — Other Ambulatory Visit: Payer: Self-pay | Admitting: Family Medicine

## 2017-05-10 ENCOUNTER — Telehealth: Payer: Self-pay

## 2017-05-10 NOTE — Telephone Encounter (Signed)
Part of the reason that her stomach is upset is because she is taking 3 NSAIDs at the same time.  This can also cause major kidney problems.  She needs to pick one that she feels hope that helps the most.  And then the recommendation would be for her to take Tylenol on top of this.  2 extra strength, 3 times a day on top of which ever anti-inflammatory she picks.  Studies have shown a 30% improvement in pain with this regimen.  We can always refer her to sports medicine if she is having specific joints that she is having major issues with.

## 2017-05-10 NOTE — Telephone Encounter (Signed)
Sheila Beck is taking celebrex, meloxicam and ibuprofen for her joint pain, hands/fingers without relief and is having some stomach upset. Diarrhea. She would like a different medication for the joint pain. Please advise.

## 2017-05-10 NOTE — Telephone Encounter (Signed)
Patient advised of recommendations. I advised patient to write down instructions.

## 2017-05-23 ENCOUNTER — Other Ambulatory Visit: Payer: Self-pay | Admitting: Family Medicine
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
## 2017-06-07 ENCOUNTER — Ambulatory Visit (INDEPENDENT_AMBULATORY_CARE_PROVIDER_SITE_OTHER): Payer: Medicare Other | Admitting: Psychiatry

## 2017-06-07 ENCOUNTER — Encounter (HOSPITAL_COMMUNITY): Payer: Self-pay | Admitting: Psychiatry

## 2017-06-07 DIAGNOSIS — R413 Other amnesia: Secondary | ICD-10-CM

## 2017-06-07 DIAGNOSIS — F1910 Other psychoactive substance abuse, uncomplicated: Secondary | ICD-10-CM | POA: Diagnosis not present

## 2017-06-07 DIAGNOSIS — F1210 Cannabis abuse, uncomplicated: Secondary | ICD-10-CM | POA: Diagnosis not present

## 2017-06-07 DIAGNOSIS — R450 Nervousness: Secondary | ICD-10-CM | POA: Diagnosis not present

## 2017-06-07 DIAGNOSIS — Z813 Family history of other psychoactive substance abuse and dependence: Secondary | ICD-10-CM

## 2017-06-07 DIAGNOSIS — Z818 Family history of other mental and behavioral disorders: Secondary | ICD-10-CM | POA: Diagnosis not present

## 2017-06-07 DIAGNOSIS — F419 Anxiety disorder, unspecified: Secondary | ICD-10-CM | POA: Diagnosis not present

## 2017-06-07 DIAGNOSIS — I10 Essential (primary) hypertension: Secondary | ICD-10-CM | POA: Diagnosis not present

## 2017-06-07 DIAGNOSIS — F17210 Nicotine dependence, cigarettes, uncomplicated: Secondary | ICD-10-CM

## 2017-06-07 DIAGNOSIS — F331 Major depressive disorder, recurrent, moderate: Secondary | ICD-10-CM

## 2017-06-07 DIAGNOSIS — Z811 Family history of alcohol abuse and dependence: Secondary | ICD-10-CM

## 2017-06-07 DIAGNOSIS — R443 Hallucinations, unspecified: Secondary | ICD-10-CM

## 2017-06-07 MED ORDER — QUETIAPINE FUMARATE 50 MG PO TABS: 50.0000 mg | ORAL_TABLET | Freq: Two times a day (BID) | ORAL | 2 refills | Status: DC

## 2017-06-07 MED ORDER — NALTREXONE HCL 50 MG PO TABS: 50.0000 mg | ORAL_TABLET | Freq: Every day | ORAL | 2 refills | Status: DC

## 2017-06-07 MED ORDER — ESCITALOPRAM OXALATE 20 MG PO TABS: 20.0000 mg | ORAL_TABLET | Freq: Every day | ORAL | 2 refills | Status: DC

## 2017-06-07 MED ORDER — BUPROPION HCL ER (XL) 300 MG PO TB24: 300.0000 mg | ORAL_TABLET | Freq: Every day | ORAL | 2 refills | Status: DC

## 2017-06-07 MED ORDER — LAMOTRIGINE 150 MG PO TABS: 150.0000 mg | ORAL_TABLET | Freq: Every day | ORAL | 2 refills | Status: DC

## 2017-06-07 MED ORDER — MIRTAZAPINE 45 MG PO TABS: 45.0000 mg | ORAL_TABLET | Freq: Every day | ORAL | 2 refills | Status: DC

## 2017-06-07 NOTE — Patient Instructions (Signed)
1. Continue escitalopram 20 mg daily 2. Continue Wellbutrin 300 mg daily 3. Continue mirtazapine 45 mg at night 4. Continue lamotrigine 150 mg daily 5. Continue naltrexone 50 mg daily 6. Continue quetiapine 50 mg twice day 7. Return to clinic in three months for 15 mins

## 2017-06-16 ENCOUNTER — Other Ambulatory Visit: Payer: Self-pay | Admitting: Family Medicine

## 2017-07-14 ENCOUNTER — Other Ambulatory Visit: Payer: Self-pay | Admitting: Family Medicine

## 2017-07-31 ENCOUNTER — Ambulatory Visit (INDEPENDENT_AMBULATORY_CARE_PROVIDER_SITE_OTHER): Payer: Medicare Other

## 2017-07-31 ENCOUNTER — Encounter: Payer: Self-pay | Admitting: Family Medicine

## 2017-07-31 ENCOUNTER — Ambulatory Visit (INDEPENDENT_AMBULATORY_CARE_PROVIDER_SITE_OTHER): Payer: Medicare Other | Admitting: Family Medicine

## 2017-07-31 VITALS — BP 141/62 | HR 62 | Ht 64.0 in | Wt 121.0 lb

## 2017-07-31 DIAGNOSIS — M79641 Pain in right hand: Secondary | ICD-10-CM | POA: Diagnosis not present

## 2017-07-31 DIAGNOSIS — M19049 Primary osteoarthritis, unspecified hand: Secondary | ICD-10-CM

## 2017-07-31 DIAGNOSIS — M2540 Effusion, unspecified joint: Secondary | ICD-10-CM

## 2017-07-31 DIAGNOSIS — M19042 Primary osteoarthritis, left hand: Secondary | ICD-10-CM

## 2017-07-31 DIAGNOSIS — Z8261 Family history of arthritis: Secondary | ICD-10-CM

## 2017-07-31 DIAGNOSIS — M79642 Pain in left hand: Secondary | ICD-10-CM | POA: Diagnosis not present

## 2017-07-31 MED ORDER — CLOBETASOL PROPIONATE 0.05 % EX OINT: 1.0000 "application " | TOPICAL_OINTMENT | Freq: Two times a day (BID) | CUTANEOUS | 1 refills | Status: AC

## 2017-07-31 MED ORDER — PREDNISONE 20 MG PO TABS: 40.0000 mg | ORAL_TABLET | Freq: Every day | ORAL | 0 refills | Status: DC
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
## 2017-08-01 LAB — CBC
HEMATOCRIT: 36.5 % (ref 35.0–45.0)
HEMOGLOBIN: 13.1 g/dL (ref 11.7–15.5)
MCH: 33.6 pg — ABNORMAL HIGH (ref 27.0–33.0)
MCHC: 35.9 g/dL (ref 32.0–36.0)
MCV: 93.6 fL (ref 80.0–100.0)
MPV: 10.4 fL (ref 7.5–12.5)
Platelets: 327 10*3/uL (ref 140–400)
RBC: 3.9 10*6/uL (ref 3.80–5.10)
RDW: 11.6 % (ref 11.0–15.0)
WBC: 6.3 10*3/uL (ref 3.8–10.8)

## 2017-08-01 LAB — SEDIMENTATION RATE: Sed Rate: 17 mm/h (ref 0–30)

## 2017-08-01 LAB — RHEUMATOID FACTOR: Rhuematoid fact SerPl-aCnc: <14 IU/mL (ref <14)

## 2017-08-01 LAB — URIC ACID: Uric Acid, Serum: 5.4 mg/dL (ref 2.5–7.0)

## 2017-08-01 LAB — C-REACTIVE PROTEIN: CRP: 0.4 mg/L (ref <8.0)

## 2017-08-01 LAB — CYCLIC CITRUL PEPTIDE ANTIBODY, IGG: Cyclic Citrullin Peptide Ab: <16 UNITS

## 2017-08-03 ENCOUNTER — Other Ambulatory Visit: Payer: Self-pay | Admitting: *Deleted

## 2017-08-03 DIAGNOSIS — M19042 Primary osteoarthritis, left hand: Principal | ICD-10-CM

## 2017-08-03 DIAGNOSIS — M19041 Primary osteoarthritis, right hand: Secondary | ICD-10-CM

## 2017-08-03 MED ORDER — MELOXICAM 15 MG PO TABS: 15.0000 mg | ORAL_TABLET | Freq: Every day | ORAL | 3 refills | Status: DC | PRN

## 2017-08-30 ENCOUNTER — Encounter: Payer: Self-pay | Admitting: Family Medicine

## 2017-08-30 ENCOUNTER — Ambulatory Visit (INDEPENDENT_AMBULATORY_CARE_PROVIDER_SITE_OTHER): Payer: Medicare Other | Admitting: Family Medicine

## 2017-08-30 VITALS — BP 135/64 | HR 69 | Ht 64.0 in | Wt 124.0 lb

## 2017-08-30 DIAGNOSIS — L309 Dermatitis, unspecified: Secondary | ICD-10-CM

## 2017-08-30 DIAGNOSIS — J42 Unspecified chronic bronchitis: Secondary | ICD-10-CM

## 2017-08-30 DIAGNOSIS — M19049 Primary osteoarthritis, unspecified hand: Secondary | ICD-10-CM | POA: Diagnosis not present

## 2017-08-30 MED ORDER — ALBUTEROL SULFATE (2.5 MG/3ML) 0.083% IN NEBU: 2.5000 mg | INHALATION_SOLUTION | Freq: Four times a day (QID) | RESPIRATORY_TRACT | 12 refills | Status: AC | PRN

## 2017-08-30 MED ORDER — ALBUTEROL SULFATE HFA 108 (90 BASE) MCG/ACT IN AERS: 2.0000 | INHALATION_SPRAY | Freq: Four times a day (QID) | RESPIRATORY_TRACT | 2 refills | Status: AC | PRN

## 2017-08-30 NOTE — Progress Notes (Signed)
Subjective:    CC: COPD, hand arthritis  HPI:    F/U COPD - she needs refill on her albuterol she hasn't had it in almost 3 months.    Follow-up arthritis-I last saw her we did additional workup just to make sure she did not have any sign of autoimmune type arthritis because she was getting some swelling and significant pain over the joints.  It was negative and was confirmatory for osteoarthritis.  She is using Mobic as needed.  Says her joints are very painful.  She is actually switched from the Mobic to ibuprofen but says she cannot take a lot of it.  She does not want to take Tylenol anymore because she is worried it will affect her liver.  She feels like she is starting to get some stiffness in her index fingers on both hands.  She says is very difficult to bend them and she feels like she is getting a lot of spasm and some charley horses only in her hands intermittently.   She also reports that her eczema is not getting better.  She is been moisturizing well.  She is been wearing gloves at night.  She tried the topical steroid creams I have given her and it really has not helped.  She says she washes her hands normally and does dishes but does not feel like her hands are excessively and water.  Past medical history, Surgical history, Family history not pertinant except as noted below, Social history, Allergies, and medications have been entered into the medical record, reviewed, and corrections made.   Review of Systems: No fevers, chills, night sweats, weight loss, chest pain, or shortness of breath.   Objective:    General: Well Developed, well nourished, and in no acute distress.  Neuro: Alert and oriented x3, extra-ocular muscles intact, sensation grossly intact.  HEENT: Normocephalic, atraumatic  Skin: Warm and dry, no rashes.  He does have some large deep cracks in several of her fingers with very dry skin.  She definitely has some arthritic MCP joints in both hands.  No definite  swelling or bogginess on exam today. Cardiac: Regular rate and rhythm, no murmurs rubs or gallops, no lower extremity edema.  Respiratory: Clear to auscultation bilaterally. Not using accessory muscles, speaking in full sentences.   Impression and Recommendations:    COPD - STable.  We will continue current regimen.  She does need a prescription for an albuterol HFA in addition to albuterol vials/nebs for her new machine.  Prescription sent to the pharmacy.  Eczema - will refer to Dermatology since hasn't responded to moisturizing and high potency steroids.    Hand OA -   Will refer to sports med for further evaluation and treatment.  I think at this point she might actually benefit from injections.  We discussed the possibility of adding Tylenol arthritis but she really wants to stay away from Tylenol for right now.  She needs to make sure she is taking her NSAID with food and water to avoid any GI upset irritation or ulcerations.
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
## 2017-09-06 ENCOUNTER — Ambulatory Visit (INDEPENDENT_AMBULATORY_CARE_PROVIDER_SITE_OTHER): Payer: Medicare Other | Admitting: Psychiatry

## 2017-09-06 ENCOUNTER — Encounter (HOSPITAL_COMMUNITY): Payer: Self-pay | Admitting: Psychiatry

## 2017-09-06 ENCOUNTER — Institutional Professional Consult (permissible substitution): Payer: Self-pay | Admitting: Sports Medicine

## 2017-09-06 ENCOUNTER — Ambulatory Visit: Payer: Self-pay | Admitting: Family Medicine

## 2017-09-06 VITALS — BP 166/82 | HR 71 | Ht 64.0 in | Wt 124.0 lb

## 2017-09-06 DIAGNOSIS — F17210 Nicotine dependence, cigarettes, uncomplicated: Secondary | ICD-10-CM

## 2017-09-06 DIAGNOSIS — F1220 Cannabis dependence, uncomplicated: Secondary | ICD-10-CM

## 2017-09-06 DIAGNOSIS — Z813 Family history of other psychoactive substance abuse and dependence: Secondary | ICD-10-CM | POA: Diagnosis not present

## 2017-09-06 DIAGNOSIS — F1020 Alcohol dependence, uncomplicated: Secondary | ICD-10-CM

## 2017-09-06 DIAGNOSIS — Z736 Limitation of activities due to disability: Secondary | ICD-10-CM

## 2017-09-06 DIAGNOSIS — F419 Anxiety disorder, unspecified: Secondary | ICD-10-CM | POA: Diagnosis not present

## 2017-09-06 DIAGNOSIS — F331 Major depressive disorder, recurrent, moderate: Secondary | ICD-10-CM

## 2017-09-06 DIAGNOSIS — Z818 Family history of other mental and behavioral disorders: Secondary | ICD-10-CM

## 2017-09-06 DIAGNOSIS — G4700 Insomnia, unspecified: Secondary | ICD-10-CM | POA: Diagnosis not present

## 2017-09-06 DIAGNOSIS — R450 Nervousness: Secondary | ICD-10-CM | POA: Diagnosis not present

## 2017-09-06 DIAGNOSIS — Z811 Family history of alcohol abuse and dependence: Secondary | ICD-10-CM

## 2017-09-06 DIAGNOSIS — I10 Essential (primary) hypertension: Secondary | ICD-10-CM

## 2017-09-06 MED ORDER — NALTREXONE HCL 50 MG PO TABS: 50.0000 mg | ORAL_TABLET | Freq: Every day | ORAL | 2 refills | Status: DC

## 2017-09-06 MED ORDER — MIRTAZAPINE 45 MG PO TABS: 45.0000 mg | ORAL_TABLET | Freq: Every day | ORAL | 2 refills | Status: DC

## 2017-09-06 MED ORDER — QUETIAPINE FUMARATE 50 MG PO TABS: 50.0000 mg | ORAL_TABLET | Freq: Two times a day (BID) | ORAL | 2 refills | Status: DC

## 2017-09-06 MED ORDER — ESCITALOPRAM OXALATE 20 MG PO TABS: 20.0000 mg | ORAL_TABLET | Freq: Every day | ORAL | 2 refills | Status: DC

## 2017-09-06 MED ORDER — BUPROPION HCL ER (XL) 300 MG PO TB24: 300.0000 mg | ORAL_TABLET | Freq: Every day | ORAL | 2 refills | Status: DC

## 2017-09-06 MED ORDER — LAMOTRIGINE 150 MG PO TABS: 150.0000 mg | ORAL_TABLET | Freq: Every day | ORAL | 2 refills | Status: DC

## 2017-09-06 NOTE — Patient Instructions (Signed)
1. Continue escitalopram 20 mg daily 2. Continue Wellbutrin 300 mg daily 3. Continue mirtazapine 45 mg at night 4. Continue lamotrigine 150 mg daily 5. Continue naltrexone 50 mg daily 6. Continue quetiapine 50 mg twice day 7.Return to clinic inthreemonthsfor 15 mins 8. Contact the therapist in her area

## 2017-09-07 ENCOUNTER — Encounter: Payer: Self-pay | Admitting: Sports Medicine

## 2017-09-07 ENCOUNTER — Ambulatory Visit (INDEPENDENT_AMBULATORY_CARE_PROVIDER_SITE_OTHER): Payer: Medicare Other | Admitting: Sports Medicine

## 2017-09-07 DIAGNOSIS — M19042 Primary osteoarthritis, left hand: Secondary | ICD-10-CM

## 2017-09-07 DIAGNOSIS — M19041 Primary osteoarthritis, right hand: Secondary | ICD-10-CM

## 2017-09-07 MED ORDER — DICLOFENAC SODIUM 2 % TD SOLN: 2.0000 | Freq: Two times a day (BID) | TRANSDERMAL | 11 refills | Status: DC

## 2017-09-07 MED ORDER — ETODOLAC 500 MG PO TABS
500.0000 mg | ORAL_TABLET | Freq: Two times a day (BID) | ORAL | 3 refills | Status: DC
Start: 2017-09-07 — End: 2018-02-28
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
## 2017-09-07 NOTE — Assessment & Plan Note (Signed)
Principal pain generators are left and right second MCPs as well as the right first MCP. Celebrex has been ineffective, discontinue meloxicam, discontinue ibuprofen. Arthritis and rheumatoid workup was negative. Switching to Lodine, patient endorses nausea with acetaminophen. Adding topical Pennsaid (diclofenac 2% topical). Return in 2 weeks, intra-articular injections if no better.

## 2017-09-13 ENCOUNTER — Ambulatory Visit: Payer: Self-pay | Admitting: Family Medicine

## 2017-09-21 ENCOUNTER — Ambulatory Visit: Payer: Self-pay | Admitting: Sports Medicine

## 2017-09-21 DIAGNOSIS — Z0189 Encounter for other specified special examinations: Secondary | ICD-10-CM

## 2017-10-31 IMAGING — DX DG HAND 2V*L*
2 series · 2 of 2 positions shown · non-contrast
Comparison: None.

CLINICAL DATA: Osteoarthritis.  Bilateral hand pain

EXAM:
LEFT HAND - 2 VIEW

[hand pa]
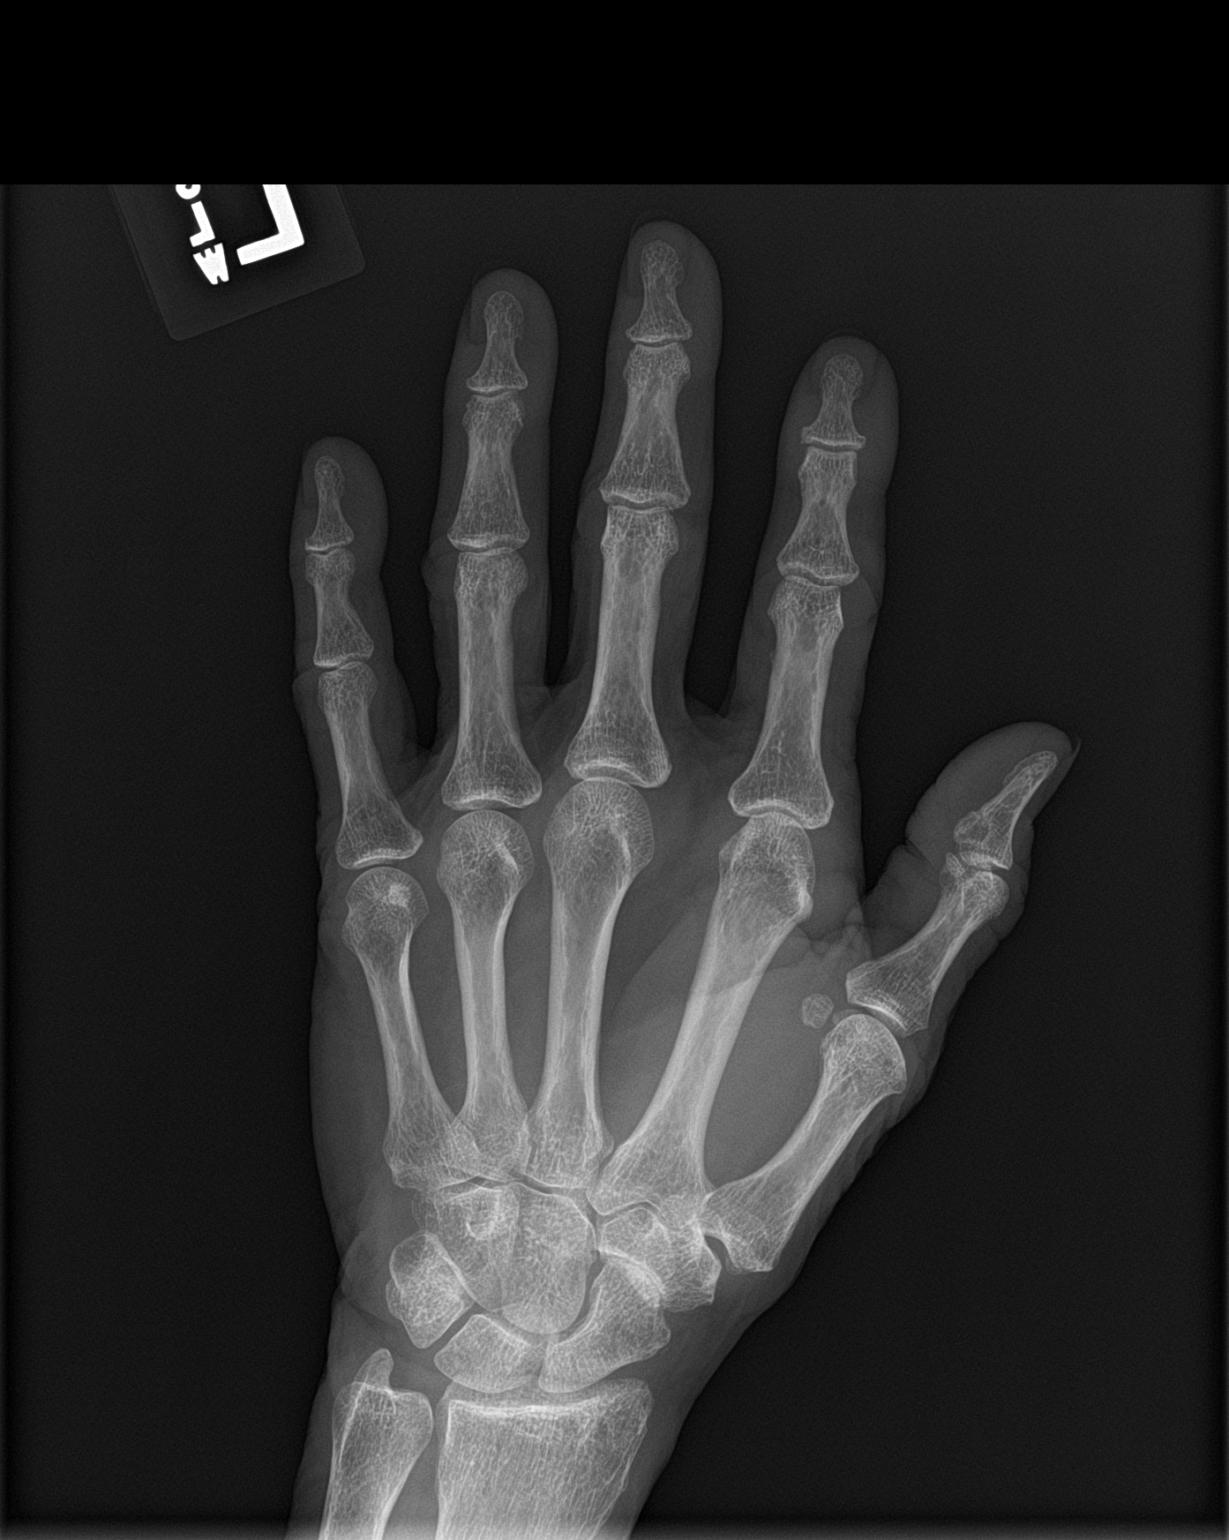

[hand lat]
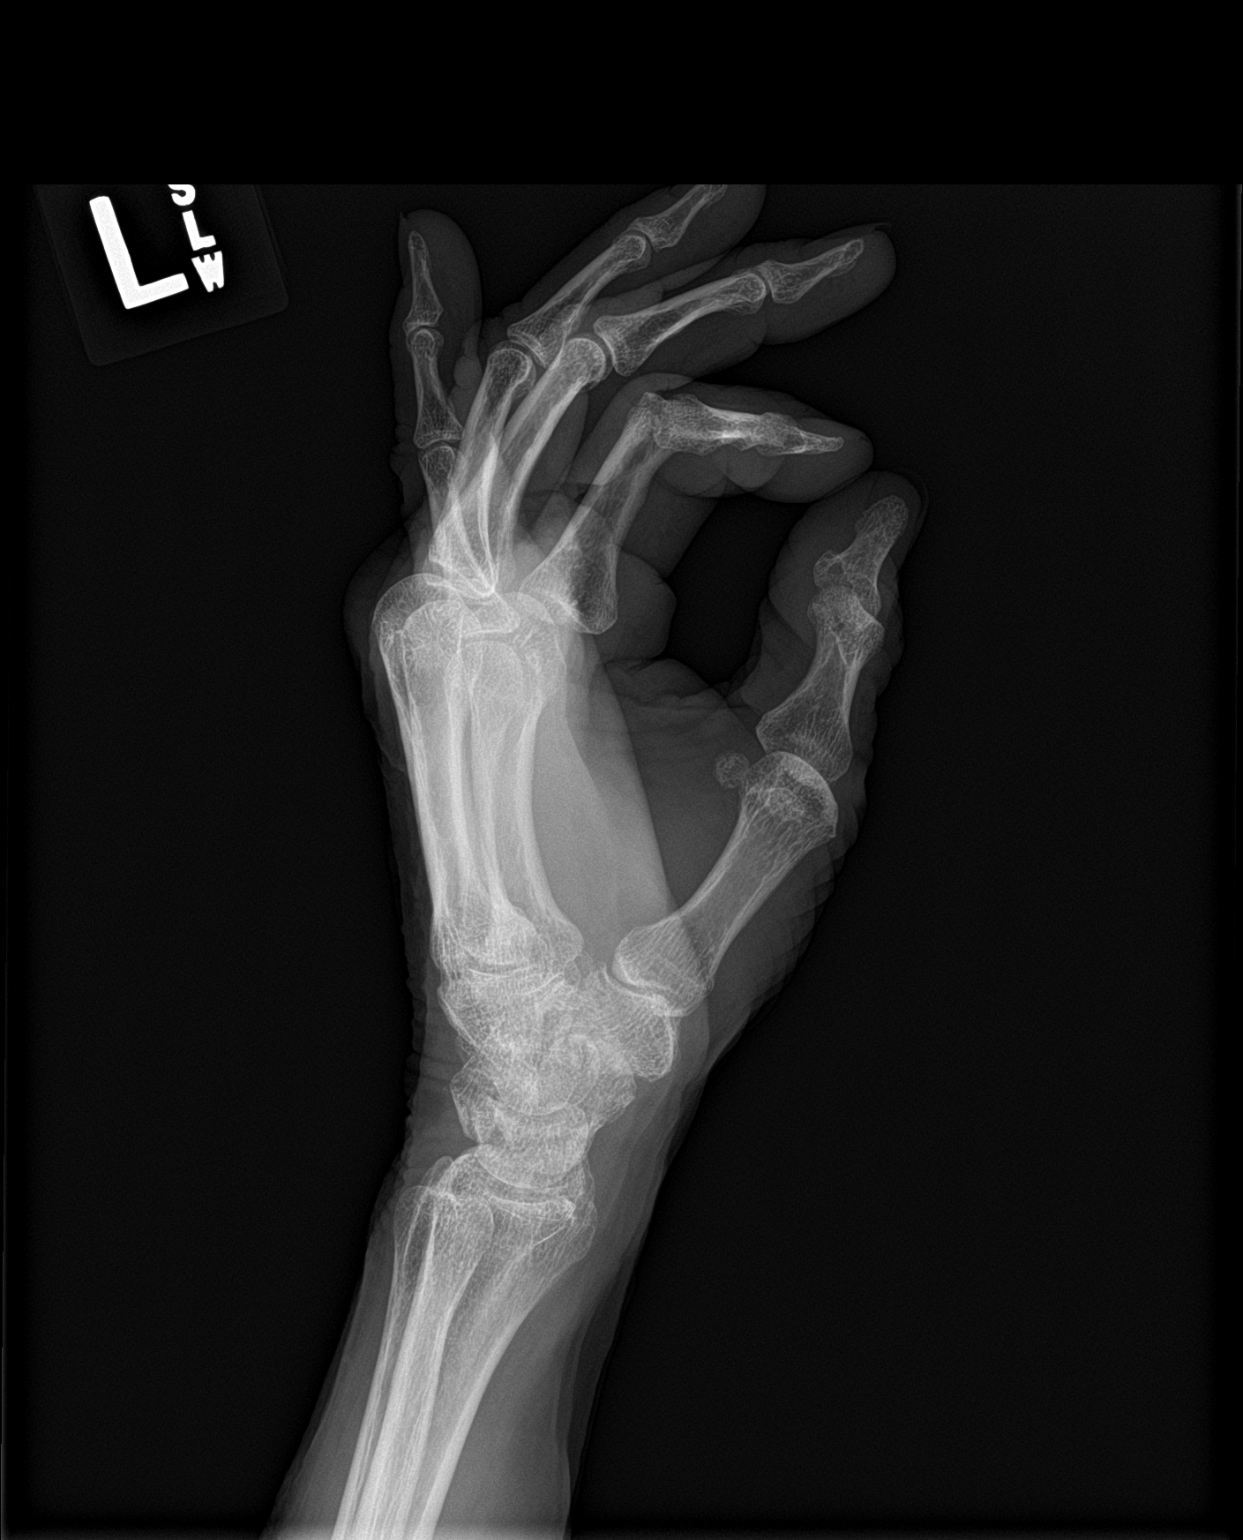

[2 of 2 positions shown; findings below may reference images not displayed]

FINDINGS: There is no evidence of fracture or dislocation. There is no
evidence of arthropathy or other focal bone abnormality. Soft
tissues are unremarkable.
IMPRESSION: Negative.

## 2017-12-06 ENCOUNTER — Ambulatory Visit (INDEPENDENT_AMBULATORY_CARE_PROVIDER_SITE_OTHER): Payer: Medicare Other | Admitting: Psychiatry

## 2017-12-06 VITALS — BP 145/73 | HR 65 | Ht 64.0 in | Wt 117.0 lb

## 2017-12-06 DIAGNOSIS — F331 Major depressive disorder, recurrent, moderate: Secondary | ICD-10-CM | POA: Diagnosis not present

## 2017-12-06 DIAGNOSIS — F1020 Alcohol dependence, uncomplicated: Secondary | ICD-10-CM

## 2017-12-06 MED ORDER — LAMOTRIGINE 150 MG PO TABS: 150.0000 mg | ORAL_TABLET | Freq: Every day | ORAL | 2 refills | Status: AC

## 2017-12-06 MED ORDER — ESCITALOPRAM OXALATE 20 MG PO TABS: 20.0000 mg | ORAL_TABLET | Freq: Every day | ORAL | 2 refills | Status: DC

## 2017-12-06 MED ORDER — BUPROPION HCL ER (XL) 300 MG PO TB24: 300.0000 mg | ORAL_TABLET | Freq: Every day | ORAL | 2 refills | Status: DC

## 2017-12-06 MED ORDER — NALTREXONE HCL 50 MG PO TABS: 50.0000 mg | ORAL_TABLET | Freq: Every day | ORAL | 2 refills | Status: DC

## 2017-12-06 MED ORDER — QUETIAPINE FUMARATE 50 MG PO TABS: 50.0000 mg | ORAL_TABLET | Freq: Two times a day (BID) | ORAL | 2 refills | Status: DC

## 2017-12-06 MED ORDER — MIRTAZAPINE 45 MG PO TABS: 45.0000 mg | ORAL_TABLET | Freq: Every day | ORAL | 2 refills | Status: DC
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
## 2017-12-06 NOTE — Patient Instructions (Signed)
1. Continue escitalopram 20 mg daily 2. Continue Wellbutrin 300 mg daily 3. Continue mirtazapine 45 mg at night 4. Continue lamotrigine 150 mg daily 5. Continue naltrexone 50 mg daily 6. Continue quetiapine 50 mg twice day 7. Return to clinic in three months for 15 mins 

## 2017-12-07 ENCOUNTER — Encounter (HOSPITAL_COMMUNITY): Payer: Self-pay | Admitting: Psychiatry

## 2017-12-07 ENCOUNTER — Other Ambulatory Visit: Payer: Self-pay

## 2017-12-07 NOTE — Patient Outreach (Signed)
[facility] Care Management  12/07/2017  Sheila BowlerLinda S Lampkins 09/21/1950 [id number]   Medication Adherence call to Mrs. Marti SleighLinda Norment left a message for patient to call back pt is due on Atorvastatin 40 mg.[name] is showing past due under [employer].   Lillia AbedAna Ollison-Moran CPhT Pharmacy Technician [facility] Direct Dial [phone]  Fax [phone] [email or web address]

## 2018-01-01 ENCOUNTER — Other Ambulatory Visit: Payer: Self-pay | Admitting: Family Medicine

## 2018-01-03 ENCOUNTER — Ambulatory Visit: Payer: Self-pay | Admitting: Family Medicine

## 2018-01-10 ENCOUNTER — Ambulatory Visit: Payer: Self-pay | Admitting: Family Medicine

## 2018-01-12 ENCOUNTER — Other Ambulatory Visit: Payer: Self-pay | Admitting: Family Medicine

## 2018-02-25 ENCOUNTER — Other Ambulatory Visit: Payer: Self-pay | Admitting: Family Medicine

## 2018-02-25 DIAGNOSIS — M19042 Primary osteoarthritis, left hand: Principal | ICD-10-CM

## 2018-02-25 DIAGNOSIS — M19041 Primary osteoarthritis, right hand: Secondary | ICD-10-CM

## 2018-02-28 ENCOUNTER — Encounter: Payer: Self-pay | Admitting: Family Medicine

## 2018-02-28 ENCOUNTER — Ambulatory Visit (INDEPENDENT_AMBULATORY_CARE_PROVIDER_SITE_OTHER): Payer: Medicare Other | Admitting: Family Medicine

## 2018-02-28 ENCOUNTER — Other Ambulatory Visit: Payer: Self-pay | Admitting: Family Medicine

## 2018-02-28 VITALS — BP 162/78 | HR 56 | Ht 64.0 in | Wt 113.0 lb

## 2018-02-28 DIAGNOSIS — J42 Unspecified chronic bronchitis: Secondary | ICD-10-CM

## 2018-02-28 DIAGNOSIS — M19042 Primary osteoarthritis, left hand: Secondary | ICD-10-CM

## 2018-02-28 DIAGNOSIS — M7062 Trochanteric bursitis, left hip: Secondary | ICD-10-CM | POA: Diagnosis not present

## 2018-02-28 DIAGNOSIS — M25511 Pain in right shoulder: Secondary | ICD-10-CM

## 2018-02-28 DIAGNOSIS — M19041 Primary osteoarthritis, right hand: Secondary | ICD-10-CM

## 2018-02-28 DIAGNOSIS — I10 Essential (primary) hypertension: Secondary | ICD-10-CM

## 2018-02-28 MED ORDER — TIOTROPIUM BROMIDE-OLODATEROL 2.5-2.5 MCG/ACT IN AERS: 2.0000 | INHALATION_SPRAY | Freq: Every day | RESPIRATORY_TRACT | 5 refills | Status: AC

## 2018-02-28 MED ORDER — MELOXICAM 15 MG PO TABS: 15.0000 mg | ORAL_TABLET | Freq: Every day | ORAL | 3 refills | Status: AC | PRN

## 2018-02-28 NOTE — Progress Notes (Signed)
Subjective:    CC: BP and COPD  HPI:  Hypertension- Pt denies chest pain, SOB, dizziness, or heart palpitations.  Taking meds as directed w/o problems.  Denies medication side effects.    F/U COPD -flares or exacerbations.  She says she uses her inhaler once a day and that seems to keep things controlled.  She has lost about 10 lbs.   She says she is at her "wits end".  She just doesn't feel like eating. She isn't sleeping well.    Complains that she is had right shoulder pain and left hip pain for about the last month.  She denies any known triggers or exacerbations.  She has not been doing any treatments at home such as heat or ice.  Has had trochanteric bursitis on that left hip before and has had an injection and felt like it was really helpful..  Does need a refill of meloxicam for her joint pain and hand arthritis.  She says it is painful to sleep on her right shoulder.  Still following with behavioral health for major depressive disorder.  Past medical history, Surgical history, Family history not pertinant except as noted below, Social history, Allergies, and medications have been entered into the medical record, reviewed, and corrections made.   Review of Systems: No fevers, chills, night sweats, weight loss, chest pain, or shortness of breath.   Objective:    General: Well Developed, well nourished, and in no acute distress.  Neuro: Alert and oriented x3, extra-ocular muscles intact, sensation grossly intact.  HEENT: Normocephalic, atraumatic  Skin: Warm and dry, no rashes. Cardiac: Regular rate and rhythm, no murmurs rubs or gallops, no lower extremity edema.  Respiratory: Clear to auscultation bilaterally. Not using accessory muscles, speaking in full sentences. MSK: She is extremely tender over the left greater trochanter.  No tenderness or pain over the anterior groin crease.  She is very weak hip abduction hip flexion and extension is 5 out of 5.  She had pain with internal  and external rotation of the hip.  With her right shoulder she was only able to extend to about 50 degrees and says it was too painful to go further.  Negative empty can test.  She is tender over the anterior shoulder joint near the bicep tendon but she is also tender laterally over the acromion and down into the deltoid.  She had pain with external rotation of the shoulder.  She was able to crossover but unable to do the liftoff test.   Impression and Recommendations:    HTN - Uncontrolled.  But she also reports that she is in a lot of pain today.  It was high the last time she was here but looking back to March it actually looked great.  She is a little upset and tearful and clearly in some mild distress today.  Some not can make any adjustments on her blood pressure medication at this point in time.  COPD-stable.  No recent flares.  Lung exam is actually pretty good today.  Left trochanteric bursitis-gust options.  She would prefer to have an injection for more acute pain relief.  Also provided handout with additional information for icing the area and gentle stretches.  Her hip abductors are quite weak.  Right shoulder pain-SPECT that she probably has bursitis.  Can try to get her in with Dr. Denyse Amass for further work-up and evaluation.  Aspiration/Injection Procedure Note FEMALE IAFRATE [id number] [birth year]  Procedure: Injection of left trochanteric bursitis  Indications: pain  Procedure Details Consent: Risks of procedure as well as the alternatives and risks of each were explained to the (patient/caregiver).  Consent for procedure obtained. Time Out: Verified patient identification, verified procedure, site/side was marked, verified correct patient position, special equipment/implants available, medications/allergies/relevent history reviewed, required imaging and test results available.  Performed   Local Anesthesia Used:Ethyl Chloride Spray Amount of Fluid Aspirated: minimal  amount Character of Fluid: straw colored Injection: 40mg  kenalog and 9 cc of lidocaine 1% without epi A sterile dressing was applied.  Patient did tolerate procedure well. Estimated blood loss: None  Nani Gasser 02/28/2018, 9:20 AM

## 2018-02-28 NOTE — Patient Instructions (Addendum)
He is scheduled to follow-up with Dr. Denyse Amass for your shoulder.  He may have an appointment tomorrow or early next week.  Did refill your Meloxicam.

## 2018-03-02 ENCOUNTER — Ambulatory Visit: Payer: Self-pay | Admitting: Family Medicine

## 2018-03-06 DIAGNOSIS — M25522 Pain in left elbow: Secondary | ICD-10-CM | POA: Diagnosis not present

## 2018-03-06 DIAGNOSIS — M1612 Unilateral primary osteoarthritis, left hip: Secondary | ICD-10-CM | POA: Diagnosis not present

## 2018-03-06 DIAGNOSIS — M25559 Pain in unspecified hip: Secondary | ICD-10-CM | POA: Diagnosis not present

## 2018-03-06 DIAGNOSIS — G8911 Acute pain due to trauma: Secondary | ICD-10-CM | POA: Diagnosis not present

## 2018-03-06 DIAGNOSIS — S4352XA Sprain of left acromioclavicular joint, initial encounter: Secondary | ICD-10-CM | POA: Diagnosis not present

## 2018-03-06 DIAGNOSIS — Z886 Allergy status to analgesic agent status: Secondary | ICD-10-CM | POA: Diagnosis not present

## 2018-03-06 DIAGNOSIS — M25512 Pain in left shoulder: Secondary | ICD-10-CM | POA: Diagnosis not present

## 2018-03-06 DIAGNOSIS — Z885 Allergy status to narcotic agent status: Secondary | ICD-10-CM | POA: Diagnosis not present

## 2018-03-24 IMAGING — DX DG HAND COMPLETE 3+V*R*
3 series · 3 of 3 positions shown · non-contrast
Comparison: Radiographs March 09, 2017.

CLINICAL DATA: Right hand pain for several months.

EXAM:
RIGHT HAND - COMPLETE 3+ VIEW

[hand pa]
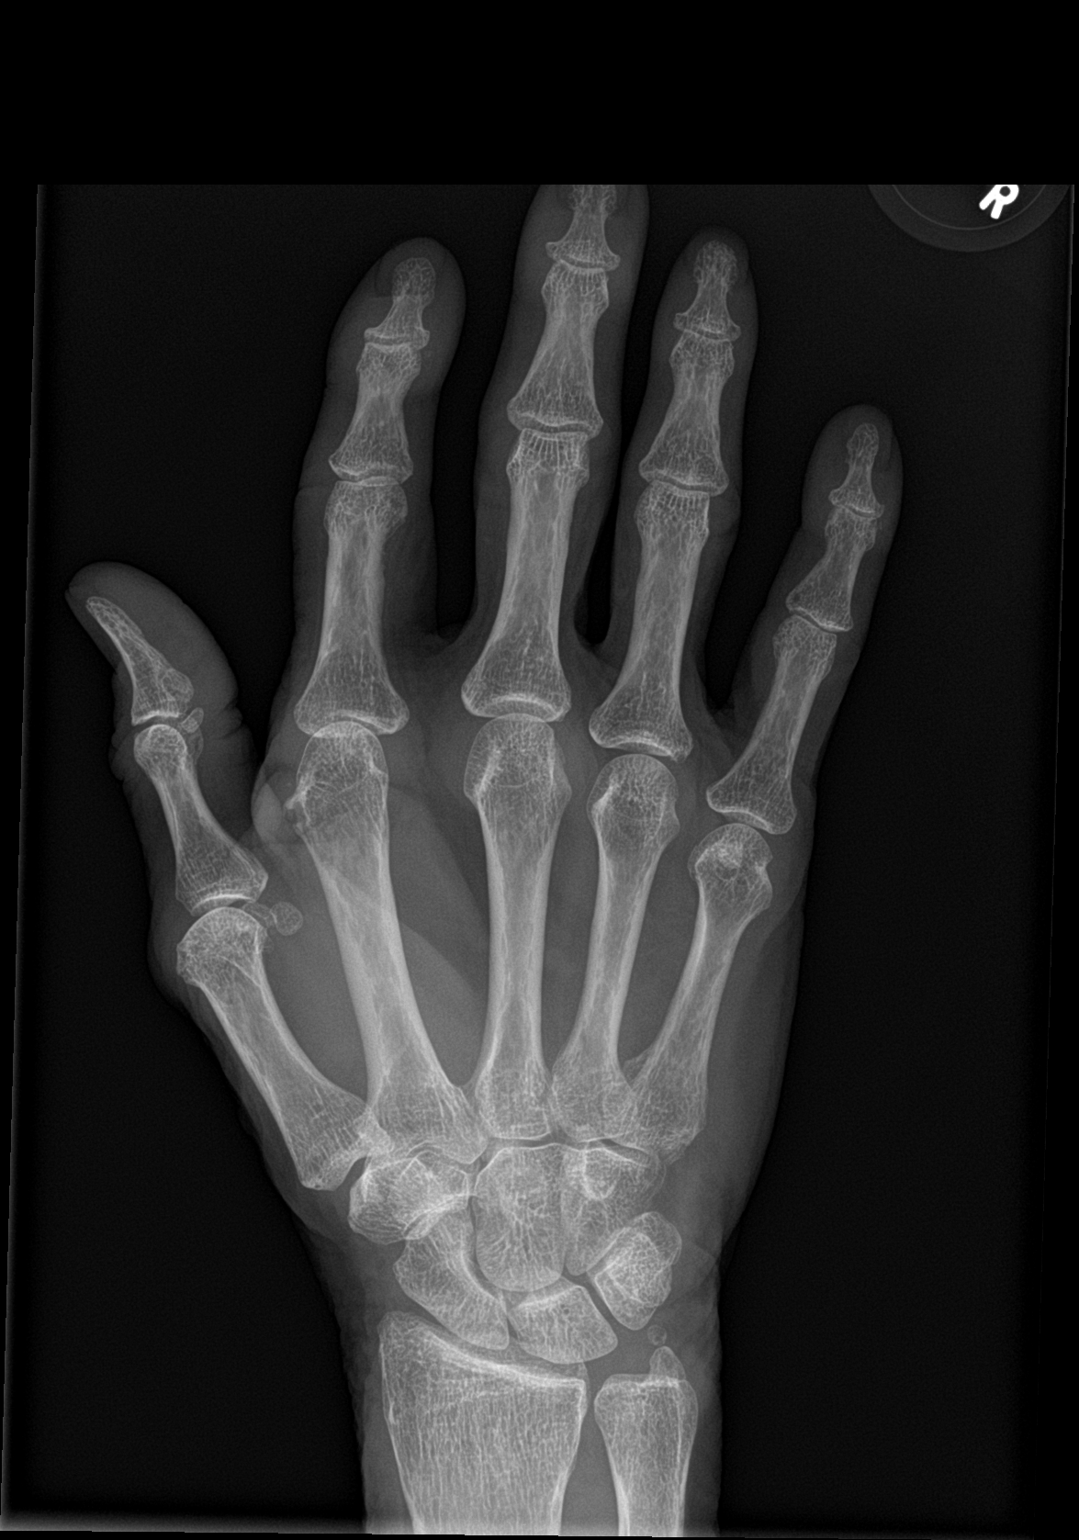

[hand obl]
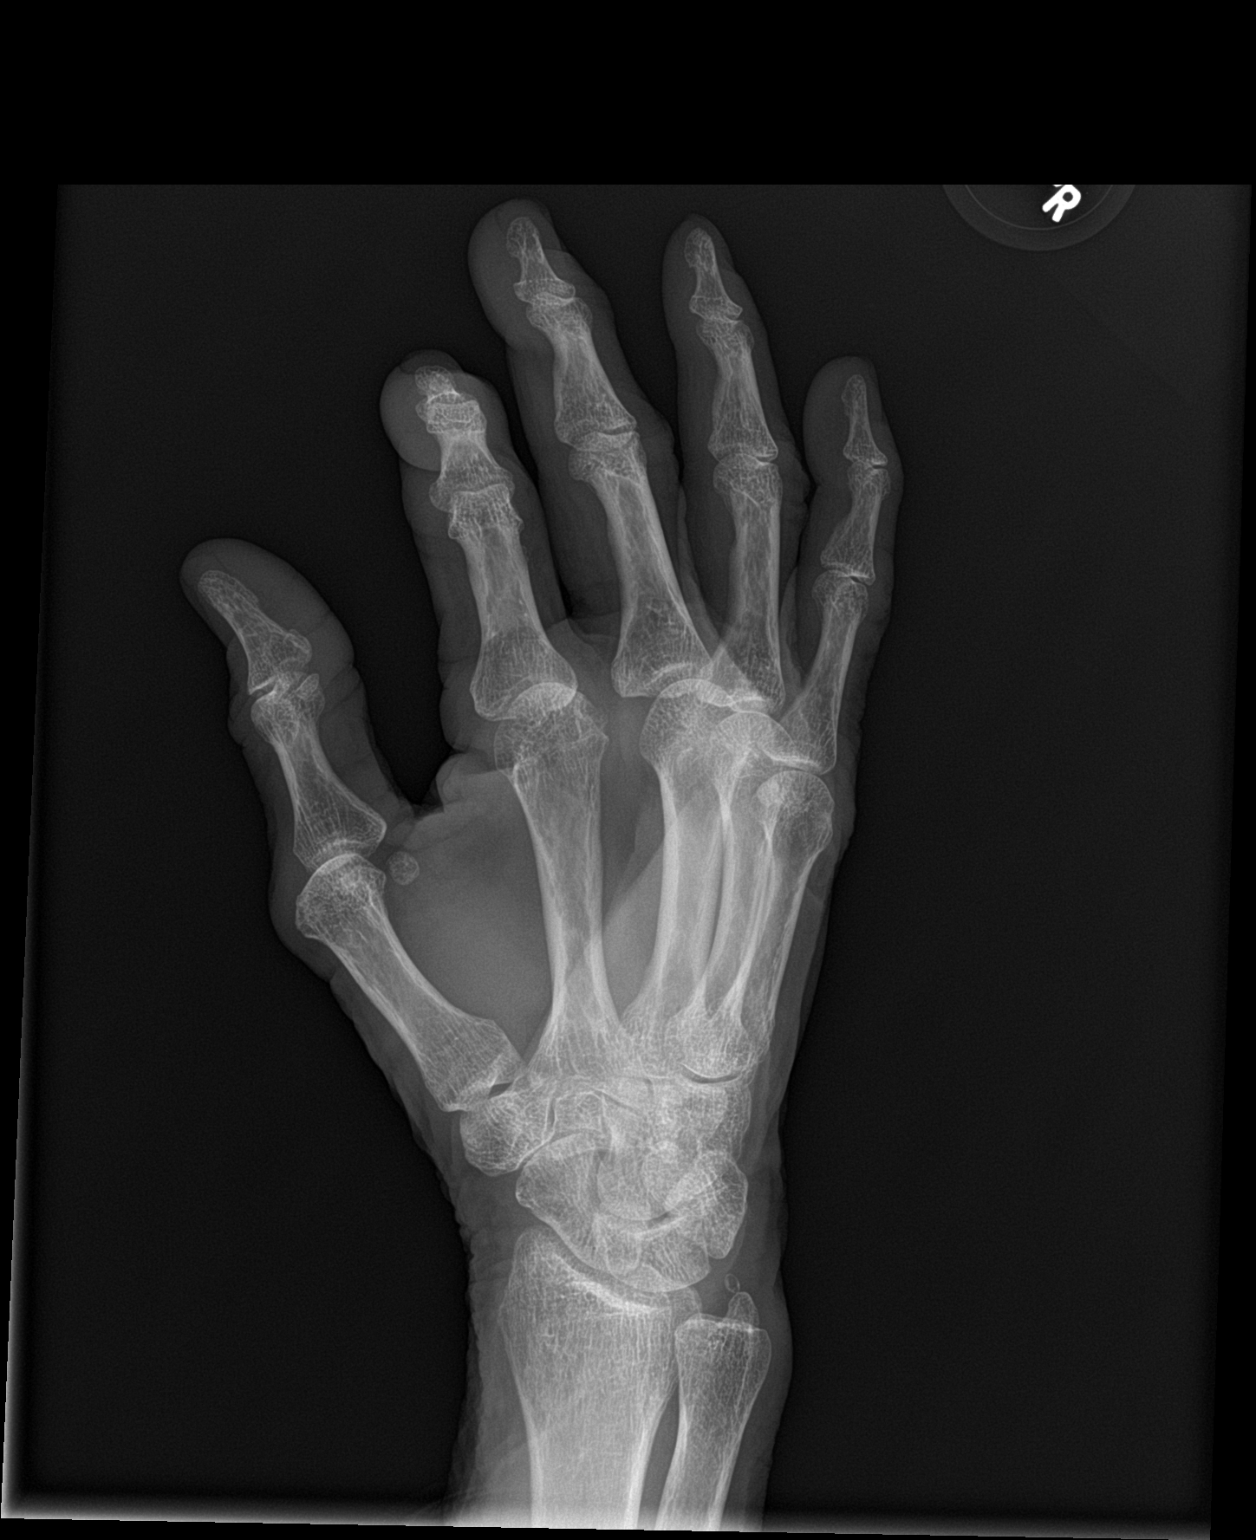

[hand lat]
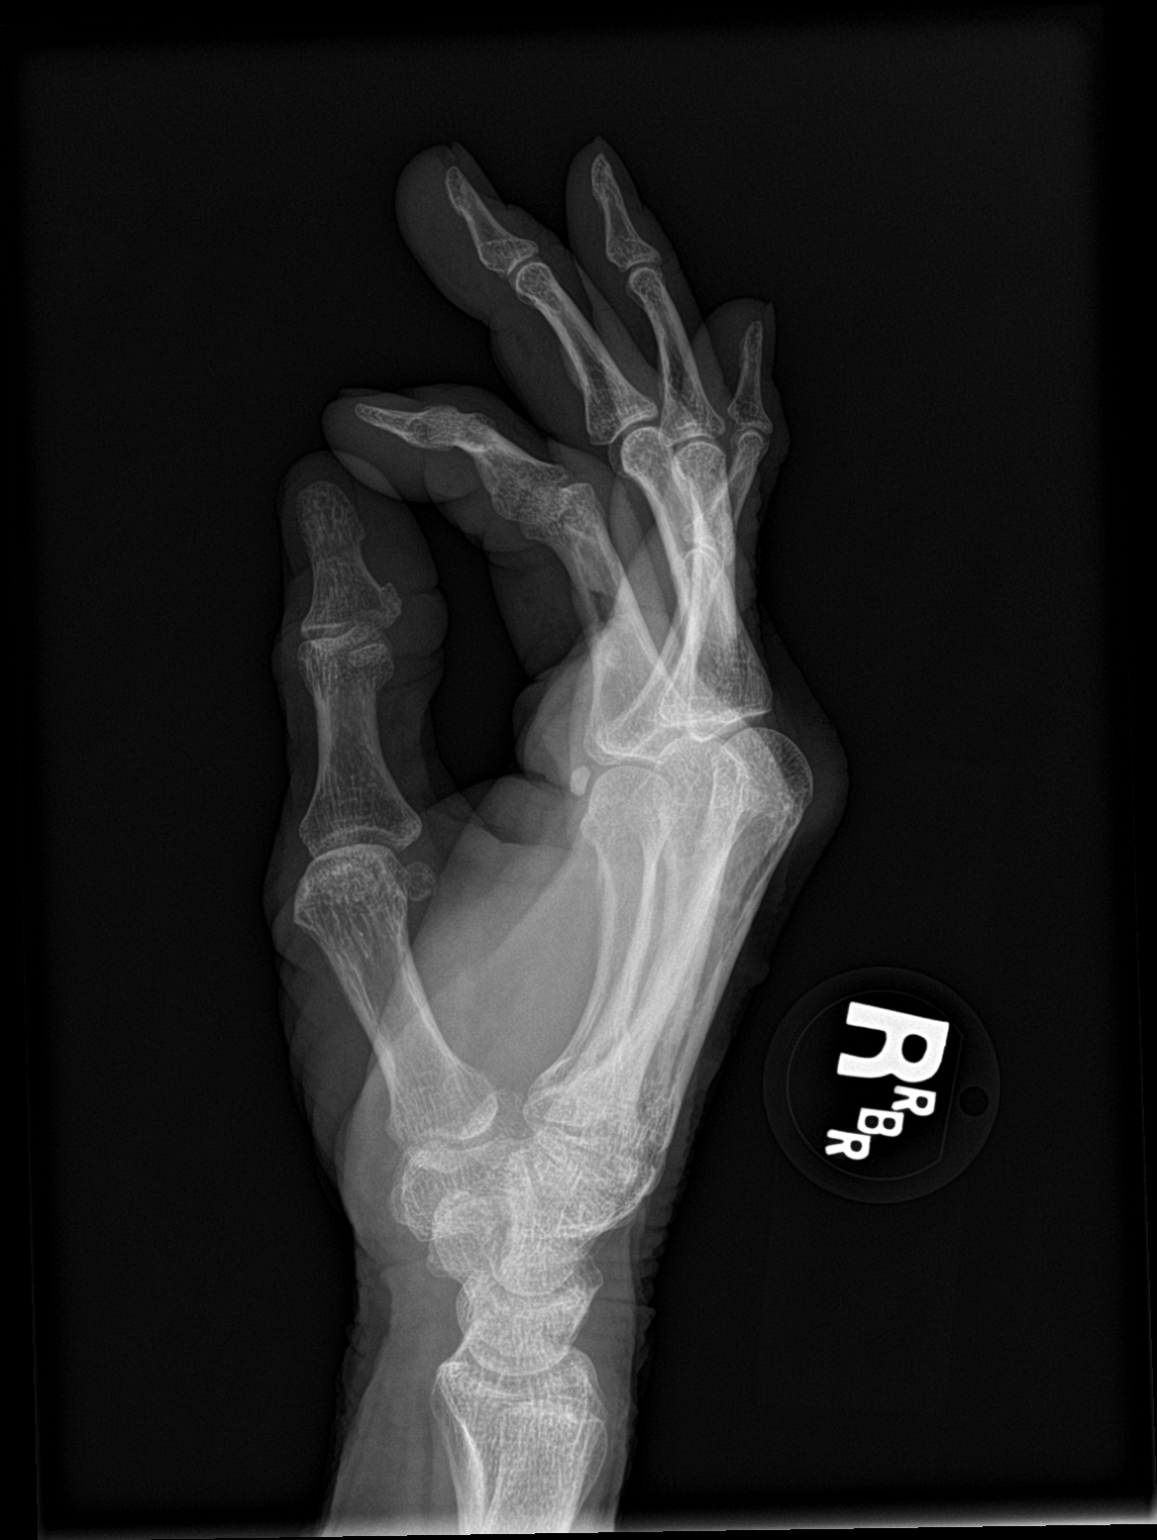

[3 of 3 positions shown; findings below may reference images not displayed]

FINDINGS: There is no evidence of fracture or dislocation. There is no
evidence of arthropathy or other focal bone abnormality. Soft
tissues are unremarkable.
IMPRESSION: No significant abnormality seen in the right hand.

## 2018-03-28 ENCOUNTER — Telehealth (HOSPITAL_COMMUNITY): Payer: Self-pay

## 2018-03-28 NOTE — Telephone Encounter (Signed)
Call daughter and told her that Dr. Gilmore Laroche said no, and  she can schedule with an outside provider. Daughter said "okay".

## 2018-03-28 NOTE — Telephone Encounter (Signed)
I have not heard anything about this patient. I don't know if Dr. Gilmore Laroche has or not.

## 2018-03-28 NOTE — Telephone Encounter (Signed)
No she was discharged or non compliant with Dr. Vanetta Shawl recommendations. Can schedule with outside provider . Nor has Dr. Vanetta Shawl called or informed

## 2018-03-28 NOTE — Telephone Encounter (Signed)
Patients mom called and said that she had been seen in the [place] office, but she wants to be seen here in this office. Daughter wants to know have you spoken with Dr. Vanetta Shawl about taking over this patients care? Please advise

## 2018-04-06 ENCOUNTER — Other Ambulatory Visit: Payer: Self-pay | Admitting: *Deleted

## 2018-04-06 MED ORDER — BUPROPION HCL ER (XL) 300 MG PO TB24: 300.0000 mg | ORAL_TABLET | Freq: Every day | ORAL | 2 refills | Status: DC

## 2018-04-06 MED ORDER — ESCITALOPRAM OXALATE 20 MG PO TABS: 20.0000 mg | ORAL_TABLET | Freq: Every day | ORAL | 2 refills | Status: DC

## 2018-04-06 MED ORDER — MIRTAZAPINE 45 MG PO TABS: 45.0000 mg | ORAL_TABLET | Freq: Every day | ORAL | 2 refills | Status: AC

## 2018-04-06 MED ORDER — NALTREXONE HCL 50 MG PO TABS: 50.0000 mg | ORAL_TABLET | Freq: Every day | ORAL | 2 refills | Status: DC

## 2018-04-06 MED ORDER — QUETIAPINE FUMARATE 50 MG PO TABS: 50.0000 mg | ORAL_TABLET | Freq: Two times a day (BID) | ORAL | 2 refills | Status: AC

## 2018-04-06 NOTE — Telephone Encounter (Signed)
Dr. Linford Arnold agreed to refill for short term.Heath Gold, CMA

## 2018-05-11 ENCOUNTER — Other Ambulatory Visit (HOSPITAL_COMMUNITY): Payer: Self-pay | Admitting: Family Medicine

## 2018-05-11 DIAGNOSIS — F331 Major depressive disorder, recurrent, moderate: Secondary | ICD-10-CM

## 2018-05-16 DIAGNOSIS — R64 Cachexia: Secondary | ICD-10-CM | POA: Diagnosis not present

## 2018-05-16 DIAGNOSIS — M25511 Pain in right shoulder: Secondary | ICD-10-CM | POA: Diagnosis not present

## 2018-05-16 DIAGNOSIS — M19041 Primary osteoarthritis, right hand: Secondary | ICD-10-CM | POA: Diagnosis not present

## 2018-05-16 DIAGNOSIS — M19042 Primary osteoarthritis, left hand: Secondary | ICD-10-CM | POA: Diagnosis not present

## 2018-05-16 DIAGNOSIS — N183 Chronic kidney disease, stage 3 (moderate): Secondary | ICD-10-CM | POA: Diagnosis not present

## 2018-05-16 DIAGNOSIS — Z9181 History of falling: Secondary | ICD-10-CM | POA: Diagnosis not present

## 2018-05-16 DIAGNOSIS — M25512 Pain in left shoulder: Secondary | ICD-10-CM | POA: Diagnosis not present

## 2018-05-16 DIAGNOSIS — M25552 Pain in left hip: Secondary | ICD-10-CM | POA: Diagnosis not present

## 2018-05-16 DIAGNOSIS — Z13820 Encounter for screening for osteoporosis: Secondary | ICD-10-CM | POA: Diagnosis not present

## 2018-05-16 DIAGNOSIS — I10 Essential (primary) hypertension: Secondary | ICD-10-CM | POA: Diagnosis not present

## 2018-05-16 DIAGNOSIS — G8929 Other chronic pain: Secondary | ICD-10-CM | POA: Diagnosis not present

## 2018-05-16 DIAGNOSIS — M19049 Primary osteoarthritis, unspecified hand: Secondary | ICD-10-CM | POA: Diagnosis not present

## 2018-05-16 DIAGNOSIS — J410 Simple chronic bronchitis: Secondary | ICD-10-CM | POA: Diagnosis not present

## 2018-05-16 DIAGNOSIS — K529 Noninfective gastroenteritis and colitis, unspecified: Secondary | ICD-10-CM | POA: Diagnosis not present

## 2018-05-16 DIAGNOSIS — M25551 Pain in right hip: Secondary | ICD-10-CM | POA: Diagnosis not present

## 2018-05-16 DIAGNOSIS — Z780 Asymptomatic menopausal state: Secondary | ICD-10-CM | POA: Diagnosis not present

## 2018-05-16 DIAGNOSIS — D511 Vitamin B12 deficiency anemia due to selective vitamin B12 malabsorption with proteinuria: Secondary | ICD-10-CM | POA: Diagnosis not present

## 2018-06-25 ENCOUNTER — Other Ambulatory Visit: Payer: Self-pay | Admitting: Family Medicine

## 2018-06-27 DIAGNOSIS — Z0000 Encounter for general adult medical examination without abnormal findings: Secondary | ICD-10-CM | POA: Diagnosis not present

## 2018-06-27 DIAGNOSIS — J410 Simple chronic bronchitis: Secondary | ICD-10-CM | POA: Diagnosis not present

## 2018-06-27 DIAGNOSIS — N183 Chronic kidney disease, stage 3 (moderate): Secondary | ICD-10-CM | POA: Diagnosis not present

## 2018-07-03 ENCOUNTER — Other Ambulatory Visit: Payer: Self-pay | Admitting: Family Medicine

## 2018-07-04 ENCOUNTER — Ambulatory Visit: Payer: Medicare Other | Admitting: Family Medicine

## 2018-07-04 DIAGNOSIS — M25511 Pain in right shoulder: Secondary | ICD-10-CM | POA: Diagnosis not present

## 2018-07-04 DIAGNOSIS — Z5181 Encounter for therapeutic drug level monitoring: Secondary | ICD-10-CM | POA: Diagnosis not present

## 2018-07-04 DIAGNOSIS — M25552 Pain in left hip: Secondary | ICD-10-CM | POA: Diagnosis not present

## 2018-07-04 DIAGNOSIS — G8929 Other chronic pain: Secondary | ICD-10-CM | POA: Diagnosis not present

## 2018-07-04 DIAGNOSIS — Z79899 Other long term (current) drug therapy: Secondary | ICD-10-CM | POA: Diagnosis not present

## 2018-07-04 DIAGNOSIS — M25512 Pain in left shoulder: Secondary | ICD-10-CM | POA: Diagnosis not present

## 2018-07-04 DIAGNOSIS — I10 Essential (primary) hypertension: Secondary | ICD-10-CM | POA: Diagnosis not present

## 2018-07-30 ENCOUNTER — Other Ambulatory Visit: Payer: Self-pay | Admitting: Sports Medicine

## 2018-07-30 DIAGNOSIS — M19041 Primary osteoarthritis, right hand: Secondary | ICD-10-CM

## 2018-07-30 DIAGNOSIS — M19042 Primary osteoarthritis, left hand: Principal | ICD-10-CM

## 2018-08-02 DIAGNOSIS — R600 Localized edema: Secondary | ICD-10-CM | POA: Diagnosis not present

## 2018-08-02 DIAGNOSIS — M25552 Pain in left hip: Secondary | ICD-10-CM | POA: Diagnosis not present

## 2018-10-30 DIAGNOSIS — M25552 Pain in left hip: Secondary | ICD-10-CM | POA: Diagnosis not present

## 2018-10-30 DIAGNOSIS — I10 Essential (primary) hypertension: Secondary | ICD-10-CM | POA: Diagnosis not present

## 2018-10-30 DIAGNOSIS — M87052 Idiopathic aseptic necrosis of left femur: Secondary | ICD-10-CM | POA: Diagnosis not present

## 2018-11-19 ENCOUNTER — Other Ambulatory Visit: Payer: Self-pay | Admitting: Family Medicine

## 2018-12-18 ENCOUNTER — Other Ambulatory Visit: Payer: Self-pay | Admitting: Family Medicine

## 2018-12-19 DIAGNOSIS — Z01818 Encounter for other preprocedural examination: Secondary | ICD-10-CM | POA: Diagnosis not present

## 2018-12-19 DIAGNOSIS — M1612 Unilateral primary osteoarthritis, left hip: Secondary | ICD-10-CM | POA: Diagnosis not present

## 2018-12-19 DIAGNOSIS — Z01810 Encounter for preprocedural cardiovascular examination: Secondary | ICD-10-CM | POA: Diagnosis not present

## 2018-12-19 DIAGNOSIS — Z01812 Encounter for preprocedural laboratory examination: Secondary | ICD-10-CM | POA: Diagnosis not present

## 2019-01-04 DIAGNOSIS — Z79899 Other long term (current) drug therapy: Secondary | ICD-10-CM | POA: Diagnosis not present

## 2019-01-04 DIAGNOSIS — Z886 Allergy status to analgesic agent status: Secondary | ICD-10-CM | POA: Diagnosis not present

## 2019-01-04 DIAGNOSIS — Z881 Allergy status to other antibiotic agents status: Secondary | ICD-10-CM | POA: Diagnosis not present

## 2019-01-04 DIAGNOSIS — I129 Hypertensive chronic kidney disease with stage 1 through stage 4 chronic kidney disease, or unspecified chronic kidney disease: Secondary | ICD-10-CM | POA: Diagnosis not present

## 2019-01-04 DIAGNOSIS — Z791 Long term (current) use of non-steroidal anti-inflammatories (NSAID): Secondary | ICD-10-CM | POA: Diagnosis not present

## 2019-01-04 DIAGNOSIS — D62 Acute posthemorrhagic anemia: Secondary | ICD-10-CM | POA: Diagnosis not present

## 2019-01-04 DIAGNOSIS — J449 Chronic obstructive pulmonary disease, unspecified: Secondary | ICD-10-CM | POA: Diagnosis not present

## 2019-01-04 DIAGNOSIS — Z1159 Encounter for screening for other viral diseases: Secondary | ICD-10-CM | POA: Diagnosis not present

## 2019-01-04 DIAGNOSIS — K219 Gastro-esophageal reflux disease without esophagitis: Secondary | ICD-10-CM | POA: Diagnosis not present

## 2019-01-04 DIAGNOSIS — E785 Hyperlipidemia, unspecified: Secondary | ICD-10-CM | POA: Diagnosis not present

## 2019-01-04 DIAGNOSIS — M87852 Other osteonecrosis, left femur: Secondary | ICD-10-CM | POA: Diagnosis not present

## 2019-01-04 DIAGNOSIS — M1612 Unilateral primary osteoarthritis, left hip: Secondary | ICD-10-CM | POA: Diagnosis not present

## 2019-01-04 DIAGNOSIS — N183 Chronic kidney disease, stage 3 (moderate): Secondary | ICD-10-CM | POA: Diagnosis not present

## 2019-01-04 DIAGNOSIS — M87052 Idiopathic aseptic necrosis of left femur: Secondary | ICD-10-CM | POA: Diagnosis not present

## 2019-01-04 DIAGNOSIS — Z888 Allergy status to other drugs, medicaments and biological substances status: Secondary | ICD-10-CM | POA: Diagnosis not present

## 2019-01-04 DIAGNOSIS — Z885 Allergy status to narcotic agent status: Secondary | ICD-10-CM | POA: Diagnosis not present

## 2019-01-09 DIAGNOSIS — Z7901 Long term (current) use of anticoagulants: Secondary | ICD-10-CM | POA: Diagnosis not present

## 2019-01-09 DIAGNOSIS — J449 Chronic obstructive pulmonary disease, unspecified: Secondary | ICD-10-CM | POA: Diagnosis not present

## 2019-01-09 DIAGNOSIS — Z471 Aftercare following joint replacement surgery: Secondary | ICD-10-CM | POA: Diagnosis not present

## 2019-01-09 DIAGNOSIS — I129 Hypertensive chronic kidney disease with stage 1 through stage 4 chronic kidney disease, or unspecified chronic kidney disease: Secondary | ICD-10-CM | POA: Diagnosis not present

## 2019-01-09 DIAGNOSIS — K219 Gastro-esophageal reflux disease without esophagitis: Secondary | ICD-10-CM | POA: Diagnosis not present

## 2019-01-09 DIAGNOSIS — E785 Hyperlipidemia, unspecified: Secondary | ICD-10-CM | POA: Diagnosis not present

## 2019-01-09 DIAGNOSIS — Z9181 History of falling: Secondary | ICD-10-CM | POA: Diagnosis not present

## 2019-01-09 DIAGNOSIS — N183 Chronic kidney disease, stage 3 (moderate): Secondary | ICD-10-CM | POA: Diagnosis not present

## 2019-01-09 DIAGNOSIS — Z96642 Presence of left artificial hip joint: Secondary | ICD-10-CM | POA: Diagnosis not present

## 2019-01-14 ENCOUNTER — Other Ambulatory Visit: Payer: Self-pay | Admitting: Sports Medicine

## 2019-01-14 DIAGNOSIS — I129 Hypertensive chronic kidney disease with stage 1 through stage 4 chronic kidney disease, or unspecified chronic kidney disease: Secondary | ICD-10-CM | POA: Diagnosis not present

## 2019-01-14 DIAGNOSIS — E785 Hyperlipidemia, unspecified: Secondary | ICD-10-CM | POA: Diagnosis not present

## 2019-01-14 DIAGNOSIS — M19041 Primary osteoarthritis, right hand: Secondary | ICD-10-CM

## 2019-01-14 DIAGNOSIS — Z7901 Long term (current) use of anticoagulants: Secondary | ICD-10-CM | POA: Diagnosis not present

## 2019-01-14 DIAGNOSIS — N183 Chronic kidney disease, stage 3 (moderate): Secondary | ICD-10-CM | POA: Diagnosis not present

## 2019-01-14 DIAGNOSIS — K219 Gastro-esophageal reflux disease without esophagitis: Secondary | ICD-10-CM | POA: Diagnosis not present

## 2019-01-14 DIAGNOSIS — J449 Chronic obstructive pulmonary disease, unspecified: Secondary | ICD-10-CM | POA: Diagnosis not present

## 2019-01-14 DIAGNOSIS — Z9181 History of falling: Secondary | ICD-10-CM | POA: Diagnosis not present

## 2019-01-14 DIAGNOSIS — Z96642 Presence of left artificial hip joint: Secondary | ICD-10-CM | POA: Diagnosis not present

## 2019-01-14 DIAGNOSIS — Z471 Aftercare following joint replacement surgery: Secondary | ICD-10-CM | POA: Diagnosis not present

## 2019-01-16 DIAGNOSIS — Z09 Encounter for follow-up examination after completed treatment for conditions other than malignant neoplasm: Secondary | ICD-10-CM | POA: Diagnosis not present

## 2019-01-16 DIAGNOSIS — R64 Cachexia: Secondary | ICD-10-CM | POA: Diagnosis not present

## 2019-01-16 DIAGNOSIS — T402X5A Adverse effect of other opioids, initial encounter: Secondary | ICD-10-CM | POA: Diagnosis not present

## 2019-01-16 DIAGNOSIS — K529 Noninfective gastroenteritis and colitis, unspecified: Secondary | ICD-10-CM | POA: Diagnosis not present

## 2019-01-16 DIAGNOSIS — K5903 Drug induced constipation: Secondary | ICD-10-CM | POA: Diagnosis not present

## 2019-01-16 DIAGNOSIS — J449 Chronic obstructive pulmonary disease, unspecified: Secondary | ICD-10-CM | POA: Diagnosis not present

## 2019-01-16 DIAGNOSIS — Z7901 Long term (current) use of anticoagulants: Secondary | ICD-10-CM | POA: Diagnosis not present

## 2019-01-16 DIAGNOSIS — Z9181 History of falling: Secondary | ICD-10-CM | POA: Diagnosis not present

## 2019-01-16 DIAGNOSIS — R252 Cramp and spasm: Secondary | ICD-10-CM | POA: Diagnosis not present

## 2019-01-16 DIAGNOSIS — N183 Chronic kidney disease, stage 3 (moderate): Secondary | ICD-10-CM | POA: Diagnosis not present

## 2019-01-16 DIAGNOSIS — R200 Anesthesia of skin: Secondary | ICD-10-CM | POA: Diagnosis not present

## 2019-01-16 DIAGNOSIS — I129 Hypertensive chronic kidney disease with stage 1 through stage 4 chronic kidney disease, or unspecified chronic kidney disease: Secondary | ICD-10-CM | POA: Diagnosis not present

## 2019-01-16 DIAGNOSIS — Z471 Aftercare following joint replacement surgery: Secondary | ICD-10-CM | POA: Diagnosis not present

## 2019-01-16 DIAGNOSIS — Z1159 Encounter for screening for other viral diseases: Secondary | ICD-10-CM | POA: Diagnosis not present

## 2019-01-16 DIAGNOSIS — Z96642 Presence of left artificial hip joint: Secondary | ICD-10-CM | POA: Diagnosis not present

## 2019-01-16 DIAGNOSIS — E785 Hyperlipidemia, unspecified: Secondary | ICD-10-CM | POA: Diagnosis not present

## 2019-01-16 DIAGNOSIS — D511 Vitamin B12 deficiency anemia due to selective vitamin B12 malabsorption with proteinuria: Secondary | ICD-10-CM | POA: Diagnosis not present

## 2019-01-16 DIAGNOSIS — K219 Gastro-esophageal reflux disease without esophagitis: Secondary | ICD-10-CM | POA: Diagnosis not present

## 2019-01-16 DIAGNOSIS — D62 Acute posthemorrhagic anemia: Secondary | ICD-10-CM | POA: Diagnosis not present

## 2019-01-18 DIAGNOSIS — E785 Hyperlipidemia, unspecified: Secondary | ICD-10-CM | POA: Diagnosis not present

## 2019-01-18 DIAGNOSIS — J449 Chronic obstructive pulmonary disease, unspecified: Secondary | ICD-10-CM | POA: Diagnosis not present

## 2019-01-18 DIAGNOSIS — Z471 Aftercare following joint replacement surgery: Secondary | ICD-10-CM | POA: Diagnosis not present

## 2019-01-18 DIAGNOSIS — I129 Hypertensive chronic kidney disease with stage 1 through stage 4 chronic kidney disease, or unspecified chronic kidney disease: Secondary | ICD-10-CM | POA: Diagnosis not present

## 2019-01-18 DIAGNOSIS — Z96642 Presence of left artificial hip joint: Secondary | ICD-10-CM | POA: Diagnosis not present

## 2019-01-18 DIAGNOSIS — N183 Chronic kidney disease, stage 3 (moderate): Secondary | ICD-10-CM | POA: Diagnosis not present

## 2019-01-18 DIAGNOSIS — Z7901 Long term (current) use of anticoagulants: Secondary | ICD-10-CM | POA: Diagnosis not present

## 2019-01-18 DIAGNOSIS — K219 Gastro-esophageal reflux disease without esophagitis: Secondary | ICD-10-CM | POA: Diagnosis not present

## 2019-01-18 DIAGNOSIS — Z9181 History of falling: Secondary | ICD-10-CM | POA: Diagnosis not present

## 2019-01-24 DIAGNOSIS — R52 Pain, unspecified: Secondary | ICD-10-CM | POA: Diagnosis not present

## 2019-01-24 DIAGNOSIS — Z96642 Presence of left artificial hip joint: Secondary | ICD-10-CM | POA: Diagnosis not present

## 2019-01-30 DIAGNOSIS — I129 Hypertensive chronic kidney disease with stage 1 through stage 4 chronic kidney disease, or unspecified chronic kidney disease: Secondary | ICD-10-CM | POA: Diagnosis not present

## 2019-01-30 DIAGNOSIS — Z471 Aftercare following joint replacement surgery: Secondary | ICD-10-CM | POA: Diagnosis not present

## 2019-01-30 DIAGNOSIS — J449 Chronic obstructive pulmonary disease, unspecified: Secondary | ICD-10-CM | POA: Diagnosis not present

## 2019-01-30 DIAGNOSIS — K219 Gastro-esophageal reflux disease without esophagitis: Secondary | ICD-10-CM | POA: Diagnosis not present

## 2019-01-30 DIAGNOSIS — E785 Hyperlipidemia, unspecified: Secondary | ICD-10-CM | POA: Diagnosis not present

## 2019-01-30 DIAGNOSIS — N183 Chronic kidney disease, stage 3 (moderate): Secondary | ICD-10-CM | POA: Diagnosis not present

## 2019-01-30 DIAGNOSIS — Z7901 Long term (current) use of anticoagulants: Secondary | ICD-10-CM | POA: Diagnosis not present

## 2019-01-30 DIAGNOSIS — Z9181 History of falling: Secondary | ICD-10-CM | POA: Diagnosis not present

## 2019-01-30 DIAGNOSIS — Z96642 Presence of left artificial hip joint: Secondary | ICD-10-CM | POA: Diagnosis not present

## 2019-02-06 DIAGNOSIS — I129 Hypertensive chronic kidney disease with stage 1 through stage 4 chronic kidney disease, or unspecified chronic kidney disease: Secondary | ICD-10-CM | POA: Diagnosis not present

## 2019-02-06 DIAGNOSIS — K219 Gastro-esophageal reflux disease without esophagitis: Secondary | ICD-10-CM | POA: Diagnosis not present

## 2019-02-06 DIAGNOSIS — Z9181 History of falling: Secondary | ICD-10-CM | POA: Diagnosis not present

## 2019-02-06 DIAGNOSIS — Z7901 Long term (current) use of anticoagulants: Secondary | ICD-10-CM | POA: Diagnosis not present

## 2019-02-06 DIAGNOSIS — J449 Chronic obstructive pulmonary disease, unspecified: Secondary | ICD-10-CM | POA: Diagnosis not present

## 2019-02-06 DIAGNOSIS — E785 Hyperlipidemia, unspecified: Secondary | ICD-10-CM | POA: Diagnosis not present

## 2019-02-06 DIAGNOSIS — N183 Chronic kidney disease, stage 3 (moderate): Secondary | ICD-10-CM | POA: Diagnosis not present

## 2019-02-06 DIAGNOSIS — Z96642 Presence of left artificial hip joint: Secondary | ICD-10-CM | POA: Diagnosis not present

## 2019-02-06 DIAGNOSIS — Z471 Aftercare following joint replacement surgery: Secondary | ICD-10-CM | POA: Diagnosis not present

## 2019-02-08 ENCOUNTER — Other Ambulatory Visit: Payer: Self-pay | Admitting: Family Medicine

## 2019-02-08 DIAGNOSIS — M19041 Primary osteoarthritis, right hand: Secondary | ICD-10-CM

## 2019-02-13 DIAGNOSIS — N183 Chronic kidney disease, stage 3 (moderate): Secondary | ICD-10-CM | POA: Diagnosis not present

## 2019-02-13 DIAGNOSIS — Z471 Aftercare following joint replacement surgery: Secondary | ICD-10-CM | POA: Diagnosis not present

## 2019-02-13 DIAGNOSIS — I129 Hypertensive chronic kidney disease with stage 1 through stage 4 chronic kidney disease, or unspecified chronic kidney disease: Secondary | ICD-10-CM | POA: Diagnosis not present

## 2019-02-13 DIAGNOSIS — K219 Gastro-esophageal reflux disease without esophagitis: Secondary | ICD-10-CM | POA: Diagnosis not present

## 2019-02-13 DIAGNOSIS — E785 Hyperlipidemia, unspecified: Secondary | ICD-10-CM | POA: Diagnosis not present

## 2019-02-13 DIAGNOSIS — J449 Chronic obstructive pulmonary disease, unspecified: Secondary | ICD-10-CM | POA: Diagnosis not present

## 2019-02-13 DIAGNOSIS — Z96642 Presence of left artificial hip joint: Secondary | ICD-10-CM | POA: Diagnosis not present

## 2019-02-13 DIAGNOSIS — Z7901 Long term (current) use of anticoagulants: Secondary | ICD-10-CM | POA: Diagnosis not present

## 2019-02-13 DIAGNOSIS — Z9181 History of falling: Secondary | ICD-10-CM | POA: Diagnosis not present

## 2019-02-14 ENCOUNTER — Other Ambulatory Visit: Payer: Self-pay | Admitting: Family Medicine

## 2019-02-14 DIAGNOSIS — M19042 Primary osteoarthritis, left hand: Secondary | ICD-10-CM

## 2019-02-14 DIAGNOSIS — M19041 Primary osteoarthritis, right hand: Secondary | ICD-10-CM

## 2019-02-21 DIAGNOSIS — R52 Pain, unspecified: Secondary | ICD-10-CM | POA: Diagnosis not present

## 2019-02-21 DIAGNOSIS — Z96642 Presence of left artificial hip joint: Secondary | ICD-10-CM | POA: Diagnosis not present

## 2019-02-22 DIAGNOSIS — Z471 Aftercare following joint replacement surgery: Secondary | ICD-10-CM | POA: Diagnosis not present

## 2019-02-22 DIAGNOSIS — Z7901 Long term (current) use of anticoagulants: Secondary | ICD-10-CM | POA: Diagnosis not present

## 2019-02-22 DIAGNOSIS — E785 Hyperlipidemia, unspecified: Secondary | ICD-10-CM | POA: Diagnosis not present

## 2019-02-22 DIAGNOSIS — J449 Chronic obstructive pulmonary disease, unspecified: Secondary | ICD-10-CM | POA: Diagnosis not present

## 2019-02-22 DIAGNOSIS — Z9181 History of falling: Secondary | ICD-10-CM | POA: Diagnosis not present

## 2019-02-22 DIAGNOSIS — I129 Hypertensive chronic kidney disease with stage 1 through stage 4 chronic kidney disease, or unspecified chronic kidney disease: Secondary | ICD-10-CM | POA: Diagnosis not present

## 2019-02-22 DIAGNOSIS — Z96642 Presence of left artificial hip joint: Secondary | ICD-10-CM | POA: Diagnosis not present

## 2019-02-22 DIAGNOSIS — K219 Gastro-esophageal reflux disease without esophagitis: Secondary | ICD-10-CM | POA: Diagnosis not present

## 2019-02-22 DIAGNOSIS — N183 Chronic kidney disease, stage 3 (moderate): Secondary | ICD-10-CM | POA: Diagnosis not present

## 2019-03-22 ENCOUNTER — Other Ambulatory Visit: Payer: Self-pay | Admitting: Family Medicine

## 2019-04-04 DIAGNOSIS — G5602 Carpal tunnel syndrome, left upper limb: Secondary | ICD-10-CM | POA: Diagnosis not present

## 2019-04-04 DIAGNOSIS — M7582 Other shoulder lesions, left shoulder: Secondary | ICD-10-CM | POA: Diagnosis not present

## 2019-04-04 DIAGNOSIS — Z96642 Presence of left artificial hip joint: Secondary | ICD-10-CM | POA: Diagnosis not present

## 2019-05-09 DIAGNOSIS — Z96642 Presence of left artificial hip joint: Secondary | ICD-10-CM | POA: Diagnosis not present

## 2019-05-09 DIAGNOSIS — G5602 Carpal tunnel syndrome, left upper limb: Secondary | ICD-10-CM | POA: Diagnosis not present

## 2019-05-09 DIAGNOSIS — I10 Essential (primary) hypertension: Secondary | ICD-10-CM | POA: Diagnosis not present

## 2019-08-07 DIAGNOSIS — N1831 Chronic kidney disease, stage 3a: Secondary | ICD-10-CM | POA: Diagnosis not present

## 2019-08-07 DIAGNOSIS — D62 Acute posthemorrhagic anemia: Secondary | ICD-10-CM | POA: Diagnosis not present

## 2019-08-07 DIAGNOSIS — R799 Abnormal finding of blood chemistry, unspecified: Secondary | ICD-10-CM | POA: Diagnosis not present

## 2019-08-07 DIAGNOSIS — I10 Essential (primary) hypertension: Secondary | ICD-10-CM | POA: Diagnosis not present

## 2019-08-14 DIAGNOSIS — Z780 Asymptomatic menopausal state: Secondary | ICD-10-CM | POA: Diagnosis not present

## 2019-08-14 DIAGNOSIS — Z0000 Encounter for general adult medical examination without abnormal findings: Secondary | ICD-10-CM | POA: Diagnosis not present

## 2019-08-14 DIAGNOSIS — N1832 Chronic kidney disease, stage 3b: Secondary | ICD-10-CM | POA: Diagnosis not present

## 2019-08-14 DIAGNOSIS — I10 Essential (primary) hypertension: Secondary | ICD-10-CM | POA: Diagnosis not present

## 2019-09-05 DIAGNOSIS — I10 Essential (primary) hypertension: Secondary | ICD-10-CM | POA: Diagnosis not present

## 2019-09-05 DIAGNOSIS — K219 Gastro-esophageal reflux disease without esophagitis: Secondary | ICD-10-CM | POA: Diagnosis not present

## 2019-09-05 DIAGNOSIS — K5900 Constipation, unspecified: Secondary | ICD-10-CM | POA: Diagnosis not present

## 2019-09-05 DIAGNOSIS — R197 Diarrhea, unspecified: Secondary | ICD-10-CM | POA: Diagnosis not present

## 2019-09-16 ENCOUNTER — Other Ambulatory Visit: Payer: Self-pay | Admitting: Family Medicine

## 2019-10-09 DIAGNOSIS — I10 Essential (primary) hypertension: Secondary | ICD-10-CM | POA: Diagnosis not present

## 2019-10-09 DIAGNOSIS — K5900 Constipation, unspecified: Secondary | ICD-10-CM | POA: Diagnosis not present

## 2019-10-09 DIAGNOSIS — K219 Gastro-esophageal reflux disease without esophagitis: Secondary | ICD-10-CM | POA: Diagnosis not present

## 2019-10-09 DIAGNOSIS — R197 Diarrhea, unspecified: Secondary | ICD-10-CM | POA: Diagnosis not present

## 2019-10-16 DIAGNOSIS — R0602 Shortness of breath: Secondary | ICD-10-CM | POA: Diagnosis not present

## 2019-10-16 DIAGNOSIS — Z01812 Encounter for preprocedural laboratory examination: Secondary | ICD-10-CM | POA: Diagnosis not present

## 2019-10-16 DIAGNOSIS — F17210 Nicotine dependence, cigarettes, uncomplicated: Secondary | ICD-10-CM | POA: Diagnosis not present

## 2019-10-16 DIAGNOSIS — J432 Centrilobular emphysema: Secondary | ICD-10-CM | POA: Diagnosis not present

## 2019-10-16 DIAGNOSIS — Z20822 Contact with and (suspected) exposure to covid-19: Secondary | ICD-10-CM | POA: Diagnosis not present

## 2019-10-30 DIAGNOSIS — Z87891 Personal history of nicotine dependence: Secondary | ICD-10-CM | POA: Diagnosis not present

## 2019-10-30 DIAGNOSIS — J432 Centrilobular emphysema: Secondary | ICD-10-CM | POA: Diagnosis not present

## 2019-10-30 DIAGNOSIS — F17210 Nicotine dependence, cigarettes, uncomplicated: Secondary | ICD-10-CM | POA: Diagnosis not present

## 2019-11-02 DIAGNOSIS — I10 Essential (primary) hypertension: Secondary | ICD-10-CM | POA: Diagnosis not present

## 2019-11-02 DIAGNOSIS — G4733 Obstructive sleep apnea (adult) (pediatric): Secondary | ICD-10-CM | POA: Diagnosis not present

## 2019-11-06 DIAGNOSIS — J449 Chronic obstructive pulmonary disease, unspecified: Secondary | ICD-10-CM | POA: Diagnosis not present

## 2019-11-06 DIAGNOSIS — I10 Essential (primary) hypertension: Secondary | ICD-10-CM | POA: Diagnosis not present

## 2019-11-06 DIAGNOSIS — F17210 Nicotine dependence, cigarettes, uncomplicated: Secondary | ICD-10-CM | POA: Diagnosis not present

## 2019-11-06 DIAGNOSIS — G4733 Obstructive sleep apnea (adult) (pediatric): Secondary | ICD-10-CM | POA: Diagnosis not present

## 2019-11-22 DIAGNOSIS — G4733 Obstructive sleep apnea (adult) (pediatric): Secondary | ICD-10-CM | POA: Diagnosis not present

## 2019-12-18 DIAGNOSIS — J449 Chronic obstructive pulmonary disease, unspecified: Secondary | ICD-10-CM | POA: Diagnosis not present

## 2019-12-18 DIAGNOSIS — G4733 Obstructive sleep apnea (adult) (pediatric): Secondary | ICD-10-CM | POA: Diagnosis not present

## 2019-12-18 DIAGNOSIS — I10 Essential (primary) hypertension: Secondary | ICD-10-CM | POA: Diagnosis not present

## 2020-10-27 ENCOUNTER — Other Ambulatory Visit: Payer: Self-pay | Admitting: Family Medicine
